# Patient Record
Sex: Male | Born: 1935 | Race: White | Hispanic: No | Marital: Married | Smoking: Former smoker
Health system: Southern US, Community
[De-identification: ages and names within clinical notes are randomized; demographics above are authoritative.]

## PROBLEM LIST (undated history)

## (undated) DIAGNOSIS — M109 Gout, unspecified: Secondary | ICD-10-CM

## (undated) DIAGNOSIS — N401 Enlarged prostate with lower urinary tract symptoms: Secondary | ICD-10-CM

## (undated) DIAGNOSIS — M1990 Unspecified osteoarthritis, unspecified site: Secondary | ICD-10-CM

## (undated) DIAGNOSIS — I4891 Unspecified atrial fibrillation: Secondary | ICD-10-CM

## (undated) DIAGNOSIS — I5033 Acute on chronic diastolic (congestive) heart failure: Principal | ICD-10-CM

## (undated) DIAGNOSIS — Z950 Presence of cardiac pacemaker: Secondary | ICD-10-CM

## (undated) DIAGNOSIS — I8310 Varicose veins of unspecified lower extremity with inflammation: Secondary | ICD-10-CM

## (undated) DIAGNOSIS — R011 Cardiac murmur, unspecified: Secondary | ICD-10-CM

## (undated) DIAGNOSIS — I10 Essential (primary) hypertension: Secondary | ICD-10-CM

## (undated) DIAGNOSIS — E119 Type 2 diabetes mellitus without complications: Secondary | ICD-10-CM

## (undated) DIAGNOSIS — C4491 Basal cell carcinoma of skin, unspecified: Secondary | ICD-10-CM

## (undated) DIAGNOSIS — IMO0002 Reserved for concepts with insufficient information to code with codable children: Secondary | ICD-10-CM

## (undated) DIAGNOSIS — R079 Chest pain, unspecified: Secondary | ICD-10-CM

## (undated) DIAGNOSIS — I059 Rheumatic mitral valve disease, unspecified: Secondary | ICD-10-CM

## (undated) DIAGNOSIS — D649 Anemia, unspecified: Secondary | ICD-10-CM

## (undated) DIAGNOSIS — I509 Heart failure, unspecified: Secondary | ICD-10-CM

## (undated) DIAGNOSIS — R39198 Other difficulties with micturition: Secondary | ICD-10-CM

## (undated) DIAGNOSIS — I8390 Asymptomatic varicose veins of unspecified lower extremity: Secondary | ICD-10-CM

## (undated) DIAGNOSIS — J45909 Unspecified asthma, uncomplicated: Secondary | ICD-10-CM

## (undated) DIAGNOSIS — R413 Other amnesia: Secondary | ICD-10-CM

## (undated) DIAGNOSIS — I209 Angina pectoris, unspecified: Secondary | ICD-10-CM

## (undated) DIAGNOSIS — Z9289 Personal history of other medical treatment: Secondary | ICD-10-CM

## (undated) DIAGNOSIS — E7800 Pure hypercholesterolemia, unspecified: Secondary | ICD-10-CM

## (undated) DIAGNOSIS — I809 Phlebitis and thrombophlebitis of unspecified site: Secondary | ICD-10-CM

## (undated) DIAGNOSIS — K921 Melena: Secondary | ICD-10-CM

## (undated) DIAGNOSIS — N139 Obstructive and reflux uropathy, unspecified: Secondary | ICD-10-CM

## (undated) HISTORY — DX: Chest pain, unspecified: R07.9

## (undated) HISTORY — DX: Anemia, unspecified: D64.9

## (undated) HISTORY — DX: Other amnesia: R41.3

## (undated) HISTORY — PX: SKIN CANCER EXCISION: SHX779

## (undated) HISTORY — DX: Melena: K92.1

## (undated) HISTORY — PX: TONSILLECTOMY: SUR1361

## (undated) HISTORY — PX: MOHS SURGERY: SUR867

## (undated) HISTORY — PX: HIP FRACTURE SURGERY: SHX118

## (undated) HISTORY — DX: Other difficulties with micturition: R39.198

## (undated) HISTORY — DX: Benign prostatic hyperplasia with lower urinary tract symptoms: N40.1

## (undated) HISTORY — PX: CATARACT EXTRACTION W/ INTRAOCULAR LENS IMPLANT: SHX1309

## (undated) HISTORY — DX: Obstructive and reflux uropathy, unspecified: N13.9

## (undated) HISTORY — PX: INSERT / REPLACE / REMOVE PACEMAKER: SUR710

## (undated) HISTORY — DX: Varicose veins of unspecified lower extremity with inflammation: I83.10

## (undated) HISTORY — DX: Rheumatic mitral valve disease, unspecified: I05.9

## (undated) HISTORY — DX: Gout, unspecified: M10.9

## (undated) HISTORY — DX: Angina pectoris, unspecified: I20.9

---

## 2011-02-06 LAB — MDC_IDC_ENUM_SESS_TYPE_REMOTE
Date Time Interrogation Session: 20120605170946
Lead Channel Impedance Value: 0 Ohm
Lead Channel Impedance Value: 456 Ohm
Lead Channel Pacing Threshold Amplitude: 0.5 V
Lead Channel Pacing Threshold Pulse Width: 0.4 ms
Lead Channel Setting Pacing Amplitude: 1 V
Lead Channel Setting Sensing Sensitivity: 2.8 mV
MDC IDC MSMT BATTERY IMPEDANCE: 1503 Ohm
MDC IDC MSMT BATTERY REMAINING LONGEVITY: 45 mo
MDC IDC MSMT BATTERY VOLTAGE: 2.76 V
MDC IDC SET LEADCHNL RV PACING PULSEWIDTH: 0.4 ms
MDC IDC STAT BRADY RV PERCENT PACED: 43 %

## 2011-05-07 ENCOUNTER — Emergency Department (HOSPITAL_BASED_OUTPATIENT_CLINIC_OR_DEPARTMENT_OTHER)
Admission: EM | Admit: 2011-05-07 | Discharge: 2011-05-07 | Disposition: A | Discharge status: Home / Self Care | Payer: Medicare Other | Attending: Emergency Medicine | Admitting: Emergency Medicine

## 2011-05-07 DIAGNOSIS — L089 Local infection of the skin and subcutaneous tissue, unspecified: Secondary | ICD-10-CM | POA: Insufficient documentation

## 2011-05-07 DIAGNOSIS — E119 Type 2 diabetes mellitus without complications: Secondary | ICD-10-CM | POA: Insufficient documentation

## 2011-05-07 DIAGNOSIS — I4891 Unspecified atrial fibrillation: Secondary | ICD-10-CM | POA: Insufficient documentation

## 2011-05-07 HISTORY — DX: Unspecified atrial fibrillation: I48.91

## 2011-05-07 HISTORY — DX: Asymptomatic varicose veins of unspecified lower extremity: I83.90

## 2011-05-07 MED ORDER — CLINDAMYCIN HCL 150 MG PO CAPS: 150.0000 mg | ORAL_CAPSULE | Freq: Three times a day (TID) | ORAL | Status: AC

## 2011-05-07 NOTE — ED Notes (Signed)
Redness, swelling and warmth noted to anterior aspect of left LE. Pt reports he has a varicose vein that bleeds but area appears infected today.

## 2011-05-07 NOTE — ED Provider Notes (Signed)
History     CSN: [id number] Arrival date & time: 05/07/2011  1:12 PM  Chief Complaint  Patient presents with  . Wound Infection   HPI Comments: Pt states that he has a history of vein protrusion to the leg and now the area around it is red and he is concerned for infection  Patient is a 77 y.o. male presenting with rash. The history is provided by the patient. No language interpreter was used.  Rash  This is a new problem. The current episode started yesterday. The problem has not changed since onset.The problem is associated with nothing. There has been no fever. The rash is present on the left lower leg. The pain is moderate. The pain has been constant since onset. Pertinent negatives include no blisters, no itching, no pain and no weeping. He has tried nothing for the symptoms. The treatment provided no relief.    Past Medical History  Diagnosis Date  . Diabetes mellitus   . Atrial fibrillation   . Varicose vein of leg     Past Surgical History  Procedure Date  . Tonsillectomy   . Joint replacement   . Pacemaker insertion     No family history on file.  History  Substance Use Topics  . Smoking status: Never Smoker   . Smokeless tobacco: Never Used  . Alcohol Use: No      Review of Systems  Constitutional: Negative.   Respiratory: Negative.   Cardiovascular: Negative.   Musculoskeletal:       Pt denies any pain with ambulation   Skin: Positive for rash. Negative for itching.  Psychiatric/Behavioral: Negative.     Physical Exam  BP 124/98  Pulse 88  Temp(Src) 98 F (36.7 C) (Oral)  Resp 20  Ht 5\' 9"  (1.753 m)  Wt 193 lb (87.544 kg)  BMI 28.50 kg/m2  SpO2 96%  Physical Exam  Nursing note and vitals reviewed. Constitutional: He appears well-developed and well-nourished.  Pulmonary/Chest: Effort normal and breath sounds normal.  Musculoskeletal: Normal range of motion.  Neurological: He is alert.  Skin:       Pt has a localized area of redness to the  left lower leg that is not fluctuant or firm and up above the areas is vein protrusion without any sign of infection    ED Course  Procedures  MDM Will treat for a localized infection:no concerning for dvt      Teressa Lower, NP 05/07/11 1448

## 2011-05-07 NOTE — ED Notes (Signed)
Affected area cleansed with saline, nonadherent dressing, 4x4 and kerlix applied.

## 2011-05-16 NOTE — ED Provider Notes (Signed)
History/physical exam/procedure(s) were performed by non-physician practitioner and as supervising physician I was immediately available for consultation/collaboration. I have reviewed all notes and am in agreement with care and plan.  Hilario Quarry, MD 05/16/11 [phone]

## 2013-11-16 ENCOUNTER — Encounter (HOSPITAL_BASED_OUTPATIENT_CLINIC_OR_DEPARTMENT_OTHER): Payer: Self-pay | Admitting: Emergency Medicine

## 2013-11-16 ENCOUNTER — Emergency Department (HOSPITAL_BASED_OUTPATIENT_CLINIC_OR_DEPARTMENT_OTHER)
Admission: EM | Admit: 2013-11-16 | Discharge: 2013-11-16 | Disposition: A | Discharge status: Home / Self Care | Payer: Medicare Other | Attending: Emergency Medicine | Admitting: Emergency Medicine

## 2013-11-16 ENCOUNTER — Emergency Department (HOSPITAL_BASED_OUTPATIENT_CLINIC_OR_DEPARTMENT_OTHER): Payer: Medicare Other

## 2013-11-16 DIAGNOSIS — E119 Type 2 diabetes mellitus without complications: Secondary | ICD-10-CM | POA: Insufficient documentation

## 2013-11-16 DIAGNOSIS — Z79899 Other long term (current) drug therapy: Secondary | ICD-10-CM | POA: Insufficient documentation

## 2013-11-16 DIAGNOSIS — T07XXXA Unspecified multiple injuries, initial encounter: Secondary | ICD-10-CM

## 2013-11-16 DIAGNOSIS — S0990XA Unspecified injury of head, initial encounter: Secondary | ICD-10-CM | POA: Insufficient documentation

## 2013-11-16 DIAGNOSIS — Y929 Unspecified place or not applicable: Secondary | ICD-10-CM | POA: Insufficient documentation

## 2013-11-16 DIAGNOSIS — Y9389 Activity, other specified: Secondary | ICD-10-CM | POA: Insufficient documentation

## 2013-11-16 DIAGNOSIS — Z950 Presence of cardiac pacemaker: Secondary | ICD-10-CM | POA: Insufficient documentation

## 2013-11-16 DIAGNOSIS — Z7901 Long term (current) use of anticoagulants: Secondary | ICD-10-CM | POA: Insufficient documentation

## 2013-11-16 DIAGNOSIS — Z7982 Long term (current) use of aspirin: Secondary | ICD-10-CM | POA: Insufficient documentation

## 2013-11-16 DIAGNOSIS — I10 Essential (primary) hypertension: Secondary | ICD-10-CM | POA: Insufficient documentation

## 2013-11-16 DIAGNOSIS — IMO0002 Reserved for concepts with insufficient information to code with codable children: Secondary | ICD-10-CM | POA: Insufficient documentation

## 2013-11-16 DIAGNOSIS — I4891 Unspecified atrial fibrillation: Secondary | ICD-10-CM | POA: Insufficient documentation

## 2013-11-16 DIAGNOSIS — W1809XA Striking against other object with subsequent fall, initial encounter: Secondary | ICD-10-CM | POA: Insufficient documentation

## 2013-11-16 HISTORY — DX: Essential (primary) hypertension: I10

## 2013-11-16 MED ORDER — LIDOCAINE-EPINEPHRINE-TETRACAINE (LET) SOLUTION
NASAL | Status: AC
  Filled 2013-11-16: qty 3

## 2013-11-16 MED ORDER — LIDOCAINE-EPINEPHRINE-TETRACAINE (LET) SOLUTION
3.0000 mL | Freq: Once | NASAL | Status: AC
  Administered 2013-11-16: 3 mL via TOPICAL

## 2013-11-16 NOTE — ED Notes (Signed)
Pt reports he fell in kitchen because his shoes were "sticky" and didn't turn when he did- states he sat down then fell backwards and hit head- denies LOC

## 2013-11-16 NOTE — ED Provider Notes (Signed)
CSN: [id number]     Arrival date & time 11/16/13  0009 History   First MD Initiated Contact with Patient 11/16/13 0029     This chart was scribed for Cydne Grahn Alfonso Patten, MD by Forrestine Him, ED Scribe. This patient was seen in room MH11/MH11 and the patient's care was started 12:31 AM.   Chief Complaint  Patient presents with  . Fall  . Head Injury   Patient is a 80 y.o. male presenting with fall. The history is provided by the patient and the spouse. No language interpreter was used.  Fall This is a new problem. The current episode started 1 to 2 hours ago. The problem occurs constantly. The problem has not changed since onset.Pertinent negatives include no chest pain, no abdominal pain, no headaches and no shortness of breath. Nothing aggravates the symptoms. Nothing relieves the symptoms. He has tried nothing for the symptoms.    HPI Comments: Siddh Vandeventer is a 80 y.o. male who presents to the Emergency Department complaining of a fall that occurred earlier this evening around 10:30.  Pt states he fell backwards in the kitchen and landed on the floor hitting his head. He states he noted an open wound to back of his head shortly after impact. He denies any vomiting at this time. States he was previously on Coumadin, but states he has stopped his prescription. Denies currently being on any other blood thinners. Pt has a PMHx of DM, A-Fib, and HTN. No other concerns this visit.    Past Medical History  Diagnosis Date  . Diabetes mellitus   . Atrial fibrillation   . Varicose vein of leg   . Hypertension    Past Surgical History  Procedure Laterality Date  . Tonsillectomy    . Joint replacement    . Pacemaker insertion     No family history on file. History  Substance Use Topics  . Smoking status: Never Smoker   . Smokeless tobacco: Never Used  . Alcohol Use: No    Review of Systems  Constitutional: Negative for fever and chills.  HENT: Negative for congestion.   Eyes:  Negative for redness.  Respiratory: Negative for cough and shortness of breath.   Cardiovascular: Negative for chest pain.  Gastrointestinal: Negative for nausea, vomiting and abdominal pain.  Skin: Positive for wound. Negative for rash.  Neurological: Negative for headaches.  Psychiatric/Behavioral: Negative for confusion.  All other systems reviewed and are negative.      Allergies  Review of patient's allergies indicates no known allergies.  Home Medications   Current Outpatient Rx  Name  Route  Sig  Dispense  Refill  . aspirin 81 MG tablet   Oral   Take 81 mg by mouth daily.           . furosemide (LASIX) 40 MG tablet   Oral   Take 40 mg by mouth daily.           . metFORMIN (GLUCOPHAGE-XR) 500 MG 24 hr tablet   Oral   Take 500 mg by mouth daily with breakfast.           . metoprolol (TOPROL-XL) 50 MG 24 hr tablet   Oral   Take 50 mg by mouth daily.           . Multiple Vitamins-Minerals (MULTIVITAMIN WITH IRON-MINERALS) liquid   Oral   Take by mouth daily.         . potassium chloride (KLOR-CON) 20 MEQ packet  Oral   Take 20 mEq by mouth daily.           . trandolapril (MAVIK) 2 MG tablet   Oral   Take 2 mg by mouth daily.           Marland Kitchen warfarin (COUMADIN) 5 MG tablet   Oral   Take 5 mg by mouth daily.            BP 103/50  Pulse 71  Temp(Src) 98.1 F (36.7 C) (Oral)  Resp 20  Ht 5\' 7"  (1.702 m)  Wt 180 lb (81.647 kg)  BMI 28.19 kg/m2  SpO2 99%  Physical Exam  Nursing note and vitals reviewed. Constitutional: He is oriented to person, place, and time. He appears well-developed and well-nourished.  HENT:  Head: Normocephalic and atraumatic. Head is without raccoon's eyes and without Battle's sign.  Right Ear: External ear normal. No mastoid tenderness. No hemotympanum.  Left Ear: No mastoid tenderness. No hemotympanum.  Mouth/Throat: Oropharynx is clear and moist.   Uvula midline  Eyes: Conjunctivae and EOM are normal. Pupils  are equal, round, and reactive to light.  Pin point bilaterally  Neck: Normal range of motion. Neck supple.  No midline tenderness crepitance or step offs  Cardiovascular: Normal rate, regular rhythm, normal heart sounds and intact distal pulses.   Pulmonary/Chest: Effort normal and breath sounds normal. No respiratory distress. He has no wheezes. He has no rales.  Abdominal: Soft. Bowel sounds are normal. He exhibits no distension. There is no tenderness. There is no rebound and no guarding.  Musculoskeletal: Normal range of motion.  No stepoff of C, T, or L spine  Neurological: He is alert and oriented to person, place, and time. He has normal reflexes.  Skin: Skin is warm and dry.  2 inch hematoma to occiput  Occiput scamp bleeding  Psychiatric: He has a normal mood and affect. Judgment normal.    ED Course  Procedures (including critical care time)  DIAGNOSTIC STUDIES: Oxygen Saturation is 99% on RA, Normal by my interpretation.    COORDINATION OF CARE: 12:30 AM- Will apply LET. Will order CBC, Basic metabolic panel, and PTINR. Discussed treatment plan with pt at bedside and pt agreed to plan.     Labs Review Labs Reviewed  CBC WITH DIFFERENTIAL  BASIC METABOLIC PANEL  PROTIME-INR   Imaging Review No results found.   EKG Interpretation None      MDM   Final diagnoses:  None    Abrasions of the scalp treated with wound sealant.  Change dressing in 24 hours apply neosporin BID x 7 days return for any problems.  Patient and wife verbalize understanding and agree to follow up  I personally performed the services described in this documentation, which was scribed in my presence. The recorded information has been reviewed and is accurate.    Carlisle Beers, MD 11/16/13 [phone]

## 2013-11-16 NOTE — Discharge Instructions (Signed)
Abrasion °An abrasion is a cut or scrape of the skin. Abrasions do not extend through all layers of the skin and most heal within 10 days. It is important to care for your abrasion properly to prevent infection. °CAUSES  °Most abrasions are caused by falling on, or gliding across, the ground or other surface. When your skin rubs on something, the outer and inner layer of skin rubs off, causing an abrasion. °DIAGNOSIS  °Your caregiver will be able to diagnose an abrasion during a physical exam.  °TREATMENT  °Your treatment depends on how large and deep the abrasion is. Generally, your abrasion will be cleaned with water and a mild soap to remove any dirt or debris. An antibiotic ointment may be put over the abrasion to prevent an infection. A bandage (dressing) may be wrapped around the abrasion to keep it from getting dirty.  °You may need a tetanus shot if: °· You cannot remember when you had your last tetanus shot. °· You have never had a tetanus shot. °· The injury broke your skin. °If you get a tetanus shot, your arm may swell, get red, and feel warm to the touch. This is common and not a problem. If you need a tetanus shot and you choose not to have one, there is a rare chance of getting tetanus. Sickness from tetanus can be serious.  °HOME CARE INSTRUCTIONS  °· If a dressing was applied, change it at least once a day or as directed by your caregiver. If the bandage sticks, soak it off with warm water.   °· Wash the area with water and a mild soap to remove all the ointment 2 times a day. Rinse off the soap and pat the area dry with a clean towel.   °· Reapply any ointment as directed by your caregiver. This will help prevent infection and keep the bandage from sticking. Use gauze over the wound and under the dressing to help keep the bandage from sticking.   °· Change your dressing right away if it becomes wet or dirty.   °· Only take over-the-counter or prescription medicines for pain, discomfort, or fever as  directed by your caregiver.   °· Follow up with your caregiver within 24 48 hours for a wound check, or as directed. If you were not given a wound-check appointment, look closely at your abrasion for redness, swelling, or pus. These are signs of infection. °SEEK IMMEDIATE MEDICAL CARE IF:  °· You have increasing pain in the wound.   °· You have redness, swelling, or tenderness around the wound.   °· You have pus coming from the wound.   °· You have a fever or persistent symptoms for more than 2 3 days. °· You have a fever and your symptoms suddenly get worse. °· You have a bad smell coming from the wound or dressing.   °MAKE SURE YOU:  °· Understand these instructions. °· Will watch your condition. °· Will get help right away if you are not doing well or get worse. °Document Released: 05/30/2005 Document Revised: 08/06/2012 Document Reviewed: 07/24/2011 °ExitCare® Patient Information ©2014 [employer]. ° °

## 2014-02-16 ENCOUNTER — Encounter: Payer: Self-pay | Admitting: *Deleted

## 2014-02-17 ENCOUNTER — Ambulatory Visit (INDEPENDENT_AMBULATORY_CARE_PROVIDER_SITE_OTHER): Payer: Medicare Other | Admitting: Internal Medicine

## 2014-02-17 ENCOUNTER — Encounter: Payer: Self-pay | Admitting: Internal Medicine

## 2014-02-17 VITALS — BP 147/75 | HR 80 | Ht 67.0 in | Wt 187.0 lb

## 2014-02-17 DIAGNOSIS — I4891 Unspecified atrial fibrillation: Secondary | ICD-10-CM

## 2014-02-17 DIAGNOSIS — Z950 Presence of cardiac pacemaker: Secondary | ICD-10-CM

## 2014-02-17 DIAGNOSIS — I10 Essential (primary) hypertension: Secondary | ICD-10-CM

## 2014-02-17 LAB — MDC_IDC_ENUM_SESS_TYPE_INCLINIC
Battery Remaining Longevity: 11 mo
Brady Statistic RV Percent Paced: 80 %
Date Time Interrogation Session: 20150617191601
Lead Channel Impedance Value: 410 Ohm
Lead Channel Pacing Threshold Amplitude: 0.5 V
Lead Channel Pacing Threshold Pulse Width: 0.4 ms
Lead Channel Sensing Intrinsic Amplitude: 5.6 mV
Lead Channel Setting Pacing Pulse Width: 0.4 ms
MDC IDC MSMT BATTERY IMPEDANCE: 4359 Ohm
MDC IDC MSMT BATTERY VOLTAGE: 2.69 V
MDC IDC MSMT LEADCHNL RA IMPEDANCE VALUE: 0 Ohm
MDC IDC SET LEADCHNL RV PACING AMPLITUDE: 2.5 V
MDC IDC SET LEADCHNL RV SENSING SENSITIVITY: 2 mV

## 2014-02-17 NOTE — Patient Instructions (Addendum)
Labs today: CMET, CBCD, TSH  Your physician has requested that you have an echocardiogram. Echocardiography is a painless test that uses sound waves to create images of your heart. It provides your doctor with information about the size and shape of your heart and how well your heart's chambers and valves are working. This procedure takes approximately one hour. There are no restrictions for this procedure.  Remote monitoring is used to monitor your pacemaker from home. This monitoring reduces the number of office visits required to check your device to one time per year. It allows [place] to keep an eye on the functioning of your device to ensure it is working properly. You are scheduled for a device check from home on 05-24-2014. You may send your transmission at any time that day. If you have a wireless device, the transmission will be sent automatically. After your physician reviews your transmission, you will receive a postcard with your next transmission date.  Your physician recommends that you schedule a follow-up appointment in: 12 months with Dr.Klein

## 2014-02-17 NOTE — Progress Notes (Signed)
ELECTROPHYSIOLOGY CONSULT NOTE  Patient ID: [name] [name], MRN: [id number], DOB/AGE: 1924/01/02 80 y.o. Admit date: (Not on file) Date of Consult: 02/17/2014  Primary Physician: Mendel Ryder, MD Primary Cardiologist: [place]  [place]   Chief Complaint: atrial fibrillation   HPI [name] [name] is a 80 y.o. male  w hx of VVI pacemaker implanted 10 + yrs ago for presumed tachy brady as patient carries a diagnosis of atrial fibrillation   He has complaints of edema DOE and PND  Although he denies chest pain or syncope  This has been longstanding   TERF incl DM and age, HTN   He is currently not on anticoagulation this may because anemia   Prior transfusions helped tremendously.            Past Medical History  Diagnosis Date  . Diabetes mellitus   . Atrial fibrillation   . Varicose vein of leg   . Hypertension   . Anemia, unspecified   . Other and unspecified angina pectoris   . Chest pain, unspecified   . Gastric ulcer, unspecified as acute or chronic, without mention of hemorrhage, perforation, or obstruction   . Gouty arthropathy, unspecified   . Blood in stool   . Memory loss   . Mitral valve disorders   . Benign localized hyperplasia of prostate with urinary obstruction and other lower urinary tract symptoms (LUTS)(600.21)   . Urinary obstruction, not elsewhere classified   . Varicose veins of lower extremities with inflammation   . Slowing of urinary stream       Surgical History:  Past Surgical History  Procedure Laterality Date  . Tonsillectomy    . Joint replacement    . Pacemaker insertion    . Cataract extraction w/ intraocular lens implant       Home Meds: Prior to Admission medications   Medication Sig Start Date End Date Taking? Authorizing Provider  aspirin 81 MG tablet Take 81 mg by mouth daily.     Yes Historical Provider, MD  furosemide (LASIX) 40 MG tablet Take 40 mg by mouth daily.     Yes Historical Provider, MD  metFORMIN  (GLUCOPHAGE-XR) 500 MG 24 hr tablet Take 500 mg by mouth daily with breakfast.     Yes Historical Provider, MD  metoprolol (TOPROL-XL) 50 MG 24 hr tablet Take 50 mg by mouth daily.     Yes Historical Provider, MD  Multiple Vitamins-Minerals (MULTIVITAMIN WITH IRON-MINERALS) liquid Take by mouth daily.   Yes Historical Provider, MD  pantoprazole (PROTONIX) 40 MG tablet Take 40 mg by mouth daily.   Yes Historical Provider, MD  potassium chloride (KLOR-CON) 20 MEQ packet Take 20 mEq by mouth daily.     Yes Historical Provider, MD  tamsulosin (FLOMAX) 0.4 MG CAPS capsule Take 0.4 mg by mouth daily.   Yes Historical Provider, MD  trandolapril (MAVIK) 2 MG tablet Take 2 mg by mouth daily.     Yes Historical Provider, MD  triamcinolone cream (KENALOG) 0.1 % Apply 1 application topically 2 (two) times daily.   Yes Historical Provider, MD    Allergies: No Known Allergies  History   Social History  . Marital Status: Married    Spouse Name: N/A    Number of Children: N/A  . Years of Education: N/A   Occupational History  . Not on file.   Social History Main Topics  . Smoking status: Never Smoker   . Smokeless tobacco: Never Used  . Alcohol Use: No  .  Drug Use: No  . Sexual Activity: Not on file   Other Topics Concern  . Not on file   Social History Narrative  . No narrative on file    History reviewed. No pertinent family history.   ROS:  Please see the history of present illness.     All other systems reviewed and negative.    Physical Exam  Blood pressure 147/75, pulse 80, height 5\' 7"  (1.702 m), weight 187 lb (84.823 kg). General: Well developed, well nourished male in no acute distress. Head: Normocephalic, atraumatic, sclera non-icteric, no xanthomas, nares are without discharge. EENT: normal Lymph Nodes:  none Back: without scoliosis/kyphosis  no CVA tendersness Neck: Negative for carotid bruits. JVD >10. Lungs: Clear bilaterally to auscultation without wheezes, rales, or  rhonchi. Breathing is unlabored. Heart: RRR with S1 S2.  3/6 systolic murmur , RV heave no rubs, or gallops appreciated. Abdomen: Soft, non-tender, non-distended with normoactive bowel sounds. No hepatomegaly. No rebound/guarding. No obvious abdominal masses. Msk:  Strength and tone appear normal for age. Extremities: No clubbing or cyanosis. 3+ edema.  Distal pedal pulses are 2+ and equal bilaterally. Skin: Warm and Dry Neuro: Alert and oriented X 3. CN III-XII intact Grossly normal sensory and motor function . Psych:  Responds to questions appropriately with a normal affect.      Labs: Cardiac Enzymes No results found for this basename: CKTOTAL, CKMB, TROPONINI,  in the last 72 hours CBC No results found for this basename: WBC, HGB, HCT, MCV, PLT   PROTIME: No results found for this basename: LABPROT, INR,  in the last 72 hours Chemistry No results found for this basename: NA, K, CL, CO2, BUN, CREATININE, CALCIUM, LABALBU, PROT, BILITOT, ALKPHOS, ALT, AST, GLUCOSE,  in the last 168 hours Lipids No results found for this basename: CHOL, HDL, LDLCALC, TRIG   BNP No results found for this basename: probnp   Miscellaneous No results found for this basename: DDIMER    Radiology/Studies:  No results found.  EKG:      Assessment and Plan:   Atrial fibrillation-history of  Pacemaker-[employer]  GI bleeding with incomplete evaluation  Congestive heart failure with significant volume overload  Abnormal supraventricular rhythm question mechanism  The patient presents with a pacemaker a history of atrial fibrillation and symptoms of congestive heart failure with evidence of right and left ventricular volume overload. We know very little. He has a murmur suggestive of mitral regurgitation.  They opened up the discussions by saying they did not want to do extensive treatments as they are nearing end-of-life.  We discussed the potential role of an ultrasound and renal function  testing to try to understand treatment options were mitigate some of the symptoms of his heart failure.  We discussed the importance of anticoagulation if in fact atrial fibrillation is the rhythm. His atrial fibrillation was diagnosed to do. His heart rhythm now is clearly not atrial fibrillation. It is absolutely fixed on RR interval. No discernible P waves are seen. In the spots following to slow ventricular paced beats no P waves discern. I suspect that this is a junctional rhythm    Virl Axe

## 2014-02-18 LAB — COMPREHENSIVE METABOLIC PANEL
ALBUMIN: 4.2 g/dL (ref 3.5–5.2)
ALK PHOS: 49 U/L (ref 39–117)
ALT: 13 U/L (ref 0–53)
AST: 22 U/L (ref 0–37)
BUN: 31 mg/dL — AB (ref 6–23)
CO2: 30 mEq/L (ref 19–32)
Calcium: 9.3 mg/dL (ref 8.4–10.5)
Chloride: 102 mEq/L (ref 96–112)
Creatinine, Ser: 1.3 mg/dL (ref 0.4–1.5)
GFR: 54.92 mL/min — ABNORMAL LOW (ref >60.00)
Glucose, Bld: 100 mg/dL — ABNORMAL HIGH (ref 70–99)
POTASSIUM: 5.7 meq/L — AB (ref 3.5–5.1)
SODIUM: 139 meq/L (ref 135–145)
Total Bilirubin: 1.4 mg/dL — ABNORMAL HIGH (ref 0.2–1.2)
Total Protein: 6.8 g/dL (ref 6.0–8.3)

## 2014-02-18 LAB — CBC WITH DIFFERENTIAL/PLATELET
BASOS PCT: 0.1 % (ref 0.0–3.0)
Basophils Absolute: 0 10*3/uL (ref 0.0–0.1)
EOS ABS: 0.1 10*3/uL (ref 0.0–0.7)
Eosinophils Relative: 1 % (ref 0.0–5.0)
HCT: 25.8 % — ABNORMAL LOW (ref 39.0–52.0)
Hemoglobin: 8.1 g/dL — ABNORMAL LOW (ref 13.0–17.0)
Lymphocytes Relative: 14.8 % (ref 12.0–46.0)
Lymphs Abs: 1 10*3/uL (ref 0.7–4.0)
MCHC: 31.4 g/dL (ref 30.0–36.0)
MCV: 79.2 fl (ref 78.0–100.0)
MONO ABS: 0.5 10*3/uL (ref 0.1–1.0)
Monocytes Relative: 6.9 % (ref 3.0–12.0)
NEUTROS ABS: 5.1 10*3/uL (ref 1.4–7.7)
NEUTROS PCT: 77.2 % — AB (ref 43.0–77.0)
Platelets: 151 10*3/uL (ref 150.0–400.0)
RBC: 3.26 Mil/uL — AB (ref 4.22–5.81)
RDW: 22.5 % — ABNORMAL HIGH (ref 11.5–15.5)
WBC: 6.6 10*3/uL (ref 4.0–10.5)

## 2014-02-18 LAB — TSH: TSH: 2.94 u[IU]/mL (ref 0.35–4.50)

## 2014-02-22 ENCOUNTER — Telehealth: Payer: Self-pay | Admitting: *Deleted

## 2014-02-22 ENCOUNTER — Other Ambulatory Visit: Payer: Medicare Other

## 2014-02-22 ENCOUNTER — Other Ambulatory Visit: Payer: Self-pay | Admitting: *Deleted

## 2014-02-22 DIAGNOSIS — D649 Anemia, unspecified: Secondary | ICD-10-CM

## 2014-02-22 DIAGNOSIS — E875 Hyperkalemia: Secondary | ICD-10-CM

## 2014-02-22 NOTE — Telephone Encounter (Signed)
Explained that we would like to draw more blood work (they had some drawn today). Need to check: BMET/CBCD/Ferritin/Fecal occult, guaiac stool card  Will send to triage to fax orders to [facility] at [facility] (as I am out of office tomorrow)      Fax # [phone]   Pt's wife is agreeable to plan.

## 2014-02-23 NOTE — Telephone Encounter (Signed)
done

## 2014-02-24 ENCOUNTER — Telehealth: Payer: Self-pay | Admitting: Internal Medicine

## 2014-02-24 NOTE — Telephone Encounter (Signed)
Stanton Kidney, RN at 02/22/2014 7:22 PM    Status: Signed        Explained that we would like to draw more blood work (they had some drawn today).  Need to check: BMET/CBCD/Ferritin/Fecal occult, guaiac stool card  Will send to triage to fax orders to [facility] at [facility] (as I am out of office tomorrow)  Fax # [phone]  Pt's wife is agreeable to plan.      Lab orders for a BMET, CBCD, Ferritin, and Guiac stool cards faxed to [facility] ([facility] manager) @ [facility] @ [phone]. Pts wife, Bonnita Nasuti, is aware.

## 2014-02-24 NOTE — Telephone Encounter (Signed)
New message      Pt is at [facility] and they are waiting for an order for pt to have labs drawn. Please call wife and let her know if we still want him to have lab work

## 2014-03-09 ENCOUNTER — Ambulatory Visit (HOSPITAL_COMMUNITY): Payer: Medicare Other | Attending: Internal Medicine | Admitting: Radiology

## 2014-03-09 DIAGNOSIS — I10 Essential (primary) hypertension: Secondary | ICD-10-CM | POA: Diagnosis not present

## 2014-03-09 DIAGNOSIS — I517 Cardiomegaly: Secondary | ICD-10-CM | POA: Diagnosis not present

## 2014-03-09 DIAGNOSIS — E119 Type 2 diabetes mellitus without complications: Secondary | ICD-10-CM | POA: Diagnosis not present

## 2014-03-09 DIAGNOSIS — I059 Rheumatic mitral valve disease, unspecified: Secondary | ICD-10-CM | POA: Diagnosis not present

## 2014-03-09 DIAGNOSIS — I079 Rheumatic tricuspid valve disease, unspecified: Secondary | ICD-10-CM | POA: Diagnosis not present

## 2014-03-09 DIAGNOSIS — I4891 Unspecified atrial fibrillation: Secondary | ICD-10-CM | POA: Insufficient documentation

## 2014-03-09 DIAGNOSIS — R079 Chest pain, unspecified: Secondary | ICD-10-CM | POA: Diagnosis not present

## 2014-03-09 DIAGNOSIS — I359 Nonrheumatic aortic valve disorder, unspecified: Secondary | ICD-10-CM | POA: Diagnosis not present

## 2014-03-09 NOTE — Progress Notes (Signed)
Echocardiogram performed.  

## 2014-03-18 ENCOUNTER — Telehealth: Payer: Self-pay | Admitting: Internal Medicine

## 2014-03-18 NOTE — Telephone Encounter (Signed)
Patient would like to know results of lab work and echo. Please call and advise.

## 2014-03-18 NOTE — Telephone Encounter (Signed)
I spoke with the pt's wife and gave her the preliminary results of Echocardiogram.  I once again reviewed the pt's June labs with her. She forgot to have the pt repeat BMP on 02/22/14 and she is unsure if the pt is taking potassium at this time or if it has been on hold since 02/18/14. I also made her aware that the pt's hemoglobin was low and that he needs to follow-up with PCP for anemia evaluation.  She would like the pt's lab results and Echo faxed to Dr Drusilla Kanner at [phone].  She plans to schedule follow-up with PCP asap to have a BMP rechecked and follow-up on anemia.  She also asked if the pt should be taking warfarin.  She states this was on the pt's medication list and that they discussed this with Dr Caryl Comes but the pt is not taking Warfarin.  I made her aware that she should not restart this medication.  The pt's hemoglobin on recent lab was 8.1.  I will forward this message to Trinidad Curet RN to have Dr Caryl Comes review the pt's echo and then fax these results to PCP per the wife's request.

## 2014-03-22 NOTE — Telephone Encounter (Signed)
Sent requested lab results and echo to PCP through [facility]

## 2014-03-23 ENCOUNTER — Telehealth: Payer: Self-pay | Admitting: Internal Medicine

## 2014-03-23 NOTE — Telephone Encounter (Signed)
New message     Talk to someone in the device clinic.  Pt needs to have his box transferred over to Dr Caryl Comes and no longer in the previous cardiologist name.

## 2014-03-23 NOTE — Telephone Encounter (Signed)
New message    Regional physician calling stating the wife called - Dr.  Caryl Comes need to have his K+ recheck.    Order fax to [place]  at [facility] fax [phone]

## 2014-03-23 NOTE — Telephone Encounter (Signed)
Spoke with pt's wife, who spoke with Dr. Caryl Comes today - will fax order for BMET to [facility].

## 2014-03-23 NOTE — Telephone Encounter (Signed)
Called and informed pt wife that monitor has been transferred to MD name and informed her to send manual transmission. Pt verbalized understanding. Pt had lab work ordered by MD done at [facility] in [place], [place].

## 2014-03-25 ENCOUNTER — Telehealth: Payer: Self-pay | Admitting: Internal Medicine

## 2014-03-25 NOTE — Telephone Encounter (Signed)
Informed wife that I did not call her.

## 2014-03-25 NOTE — Telephone Encounter (Signed)
New message ° ° ° ° °Returning Sherri's call °

## 2014-03-26 ENCOUNTER — Encounter: Payer: Self-pay | Admitting: Internal Medicine

## 2014-03-31 ENCOUNTER — Encounter (HOSPITAL_COMMUNITY): Payer: Self-pay | Admitting: Emergency Medicine

## 2014-03-31 ENCOUNTER — Emergency Department (HOSPITAL_COMMUNITY): Payer: Medicare Other

## 2014-03-31 ENCOUNTER — Inpatient Hospital Stay (HOSPITAL_COMMUNITY)
Admission: EM | Admit: 2014-03-31 | Discharge: 2014-04-03 | DRG: 293 | Disposition: A | Discharge status: Home / Self Care | Payer: Medicare Other | Attending: Family Medicine | Admitting: Family Medicine

## 2014-03-31 DIAGNOSIS — I440 Atrioventricular block, first degree: Secondary | ICD-10-CM | POA: Diagnosis present

## 2014-03-31 DIAGNOSIS — I279 Pulmonary heart disease, unspecified: Secondary | ICD-10-CM

## 2014-03-31 DIAGNOSIS — I272 Pulmonary hypertension, unspecified: Secondary | ICD-10-CM

## 2014-03-31 DIAGNOSIS — I5032 Chronic diastolic (congestive) heart failure: Secondary | ICD-10-CM | POA: Diagnosis present

## 2014-03-31 DIAGNOSIS — I059 Rheumatic mitral valve disease, unspecified: Secondary | ICD-10-CM | POA: Diagnosis present

## 2014-03-31 DIAGNOSIS — I071 Rheumatic tricuspid insufficiency: Secondary | ICD-10-CM

## 2014-03-31 DIAGNOSIS — D649 Anemia, unspecified: Secondary | ICD-10-CM | POA: Diagnosis present

## 2014-03-31 DIAGNOSIS — N138 Other obstructive and reflux uropathy: Secondary | ICD-10-CM | POA: Diagnosis present

## 2014-03-31 DIAGNOSIS — K259 Gastric ulcer, unspecified as acute or chronic, without hemorrhage or perforation: Secondary | ICD-10-CM | POA: Diagnosis present

## 2014-03-31 DIAGNOSIS — R079 Chest pain, unspecified: Secondary | ICD-10-CM

## 2014-03-31 DIAGNOSIS — I509 Heart failure, unspecified: Secondary | ICD-10-CM | POA: Diagnosis present

## 2014-03-31 DIAGNOSIS — N401 Enlarged prostate with lower urinary tract symptoms: Secondary | ICD-10-CM | POA: Diagnosis present

## 2014-03-31 DIAGNOSIS — I4891 Unspecified atrial fibrillation: Secondary | ICD-10-CM

## 2014-03-31 DIAGNOSIS — I10 Essential (primary) hypertension: Secondary | ICD-10-CM | POA: Diagnosis present

## 2014-03-31 DIAGNOSIS — Z9660 Presence of unspecified orthopedic joint implant: Secondary | ICD-10-CM

## 2014-03-31 DIAGNOSIS — I079 Rheumatic tricuspid valve disease, unspecified: Secondary | ICD-10-CM | POA: Diagnosis present

## 2014-03-31 DIAGNOSIS — Z7982 Long term (current) use of aspirin: Secondary | ICD-10-CM

## 2014-03-31 DIAGNOSIS — Z950 Presence of cardiac pacemaker: Secondary | ICD-10-CM

## 2014-03-31 DIAGNOSIS — I5033 Acute on chronic diastolic (congestive) heart failure: Principal | ICD-10-CM | POA: Diagnosis present

## 2014-03-31 DIAGNOSIS — R0600 Dyspnea, unspecified: Secondary | ICD-10-CM | POA: Diagnosis present

## 2014-03-31 DIAGNOSIS — I517 Cardiomegaly: Secondary | ICD-10-CM | POA: Diagnosis present

## 2014-03-31 DIAGNOSIS — E119 Type 2 diabetes mellitus without complications: Secondary | ICD-10-CM | POA: Diagnosis present

## 2014-03-31 DIAGNOSIS — M109 Gout, unspecified: Secondary | ICD-10-CM | POA: Diagnosis present

## 2014-03-31 DIAGNOSIS — N139 Obstructive and reflux uropathy, unspecified: Secondary | ICD-10-CM | POA: Diagnosis present

## 2014-03-31 DIAGNOSIS — D509 Iron deficiency anemia, unspecified: Secondary | ICD-10-CM | POA: Diagnosis present

## 2014-03-31 HISTORY — DX: Cardiac murmur, unspecified: R01.1

## 2014-03-31 HISTORY — DX: Unspecified asthma, uncomplicated: J45.909

## 2014-03-31 HISTORY — DX: Unspecified osteoarthritis, unspecified site: M19.90

## 2014-03-31 HISTORY — DX: Heart failure, unspecified: I50.9

## 2014-03-31 HISTORY — DX: Phlebitis and thrombophlebitis of unspecified site: I80.9

## 2014-03-31 HISTORY — DX: Personal history of other medical treatment: Z92.89

## 2014-03-31 HISTORY — DX: Reserved for concepts with insufficient information to code with codable children: IMO0002

## 2014-03-31 HISTORY — DX: Acute on chronic diastolic (congestive) heart failure: I50.33

## 2014-03-31 HISTORY — DX: Pure hypercholesterolemia, unspecified: E78.00

## 2014-03-31 HISTORY — DX: Basal cell carcinoma of skin, unspecified: C44.91

## 2014-03-31 HISTORY — DX: Presence of cardiac pacemaker: Z95.0

## 2014-03-31 HISTORY — DX: Type 2 diabetes mellitus without complications: E11.9

## 2014-03-31 LAB — BASIC METABOLIC PANEL
ANION GAP: 14 (ref 5–15)
BUN: 35 mg/dL — ABNORMAL HIGH (ref 6–23)
CHLORIDE: 100 meq/L (ref 96–112)
CO2: 28 mEq/L (ref 19–32)
CREATININE: 1.33 mg/dL (ref 0.50–1.35)
Calcium: 8.8 mg/dL (ref 8.4–10.5)
GFR calc non Af Amer: 46 mL/min — ABNORMAL LOW (ref >90)
GFR, EST AFRICAN AMERICAN: 54 mL/min — AB (ref >90)
Glucose, Bld: 132 mg/dL — ABNORMAL HIGH (ref 70–99)
POTASSIUM: 3.7 meq/L (ref 3.7–5.3)
Sodium: 142 mEq/L (ref 137–147)

## 2014-03-31 LAB — POC OCCULT BLOOD, ED: FECAL OCCULT BLD: NEGATIVE

## 2014-03-31 LAB — PREPARE RBC (CROSSMATCH)

## 2014-03-31 LAB — CBC
HCT: 24.4 % — ABNORMAL LOW (ref 39.0–52.0)
Hemoglobin: 7.2 g/dL — ABNORMAL LOW (ref 13.0–17.0)
MCH: 21.4 pg — ABNORMAL LOW (ref 26.0–34.0)
MCHC: 29.5 g/dL — ABNORMAL LOW (ref 30.0–36.0)
MCV: 72.4 fL — ABNORMAL LOW (ref 78.0–100.0)
PLATELETS: 158 10*3/uL (ref 150–400)
RBC: 3.37 MIL/uL — ABNORMAL LOW (ref 4.22–5.81)
RDW: 21.3 % — AB (ref 11.5–15.5)
WBC: 6 10*3/uL (ref 4.0–10.5)

## 2014-03-31 LAB — TROPONIN I

## 2014-03-31 LAB — ABO/RH: ABO/RH(D): A POS

## 2014-03-31 LAB — PRO B NATRIURETIC PEPTIDE: Pro B Natriuretic peptide (BNP): 1194 pg/mL — ABNORMAL HIGH (ref 0–450)

## 2014-03-31 LAB — GLUCOSE, CAPILLARY: Glucose-Capillary: 105 mg/dL — ABNORMAL HIGH (ref 70–99)

## 2014-03-31 MED ORDER — FUROSEMIDE 10 MG/ML IJ SOLN: 20.0000 mg | Freq: Once | INTRAMUSCULAR | Status: DC

## 2014-03-31 MED ORDER — SODIUM CHLORIDE 0.9 % IJ SOLN: 3.0000 mL | INTRAMUSCULAR | Status: DC | PRN

## 2014-03-31 MED ORDER — TRIAMCINOLONE ACETONIDE 0.1 % EX CREA: 1.0000 "application " | TOPICAL_CREAM | Freq: Two times a day (BID) | CUTANEOUS | Status: DC | PRN

## 2014-03-31 MED ORDER — METOPROLOL SUCCINATE ER 50 MG PO TB24
50.0000 mg | ORAL_TABLET | Freq: Every day | ORAL | Status: DC
  Administered 2014-04-01 – 2014-04-03 (×3): 50 mg via ORAL
  Filled 2014-03-31 (×3): qty 1

## 2014-03-31 MED ORDER — SODIUM CHLORIDE 0.9 % IV SOLN: 250.0000 mL | INTRAVENOUS | Status: DC | PRN

## 2014-03-31 MED ORDER — SODIUM CHLORIDE 0.9 % IJ SOLN
3.0000 mL | Freq: Two times a day (BID) | INTRAMUSCULAR | Status: DC
  Administered 2014-04-01 – 2014-04-02 (×2): 3 mL via INTRAVENOUS

## 2014-03-31 MED ORDER — ASPIRIN EC 81 MG PO TBEC
81.0000 mg | DELAYED_RELEASE_TABLET | Freq: Every day | ORAL | Status: DC
  Administered 2014-04-01 – 2014-04-03 (×3): 81 mg via ORAL
  Filled 2014-03-31 (×3): qty 1

## 2014-03-31 MED ORDER — SODIUM CHLORIDE 0.9 % IJ SOLN
3.0000 mL | Freq: Two times a day (BID) | INTRAMUSCULAR | Status: DC
  Administered 2014-03-31 – 2014-04-03 (×5): 3 mL via INTRAVENOUS

## 2014-03-31 MED ORDER — PANTOPRAZOLE SODIUM 40 MG PO TBEC
40.0000 mg | DELAYED_RELEASE_TABLET | Freq: Every day | ORAL | Status: DC
  Administered 2014-03-31 – 2014-04-03 (×4): 40 mg via ORAL
  Filled 2014-03-31 (×4): qty 1

## 2014-03-31 MED ORDER — FUROSEMIDE 10 MG/ML IJ SOLN
40.0000 mg | Freq: Two times a day (BID) | INTRAMUSCULAR | Status: DC
  Administered 2014-04-01 – 2014-04-02 (×3): 40 mg via INTRAVENOUS
  Filled 2014-03-31 (×6): qty 4

## 2014-03-31 MED ORDER — TRANDOLAPRIL 4 MG PO TABS
4.0000 mg | ORAL_TABLET | Freq: Every day | ORAL | Status: DC
  Administered 2014-04-01 – 2014-04-03 (×3): 4 mg via ORAL
  Filled 2014-03-31 (×3): qty 1

## 2014-03-31 MED ORDER — TAMSULOSIN HCL 0.4 MG PO CAPS
0.4000 mg | ORAL_CAPSULE | Freq: Every day | ORAL | Status: DC
  Administered 2014-03-31 – 2014-04-02 (×3): 0.4 mg via ORAL
  Filled 2014-03-31 (×4): qty 1

## 2014-03-31 MED ORDER — FUROSEMIDE 10 MG/ML IJ SOLN: 40.0000 mg | Freq: Two times a day (BID) | INTRAMUSCULAR | Status: DC

## 2014-03-31 MED ORDER — INSULIN ASPART 100 UNIT/ML ~~LOC~~ SOLN
0.0000 [IU] | SUBCUTANEOUS | Status: DC
  Administered 2014-04-01: 1 [IU] via SUBCUTANEOUS

## 2014-03-31 MED ORDER — ADULT MULTIVITAMIN W/MINERALS CH
1.0000 | ORAL_TABLET | Freq: Every day | ORAL | Status: DC
  Administered 2014-03-31 – 2014-04-03 (×4): 1 via ORAL
  Filled 2014-03-31 (×3): qty 1

## 2014-03-31 NOTE — ED Provider Notes (Signed)
I spoke with cardiology, who would be glad to consult and assist in the patient's care  Garald Balding, NP 03/31/14 2036

## 2014-03-31 NOTE — ED Notes (Signed)
Cardiology at bedside.

## 2014-03-31 NOTE — ED Provider Notes (Addendum)
CSN: [id number]     Arrival date & time 03/31/14  1747 History   First MD Initiated Contact with Patient 03/31/14 1820     Chief Complaint  Patient presents with  . Congestive Heart Failure     (Consider location/radiation/quality/duration/timing/severity/associated sxs/prior Treatment) HPI Comments: Patient is an 80 year old male with history of diabetes, A. fib, hypertension, and CHF. He presents with complaints of dyspnea on exertion which has been worsening over the past several days. He denies any chest pain, fever, productive cough. He does report increased swelling of his legs. He takes Lasix and denies having his dose changed or missing any doses.  Patient is a 80 y.o. male presenting with CHF. The history is provided by the patient.  Congestive Heart Failure This is a new problem. The current episode started 2 days ago. The problem occurs constantly. The problem has been gradually worsening. Associated symptoms include shortness of breath. Pertinent negatives include no chest pain. The symptoms are aggravated by walking. Nothing relieves the symptoms. He has tried nothing for the symptoms. The treatment provided no relief.    Past Medical History  Diagnosis Date  . Diabetes mellitus   . Atrial fibrillation   . Varicose vein of leg   . Hypertension   . Anemia, unspecified   . Other and unspecified angina pectoris   . Chest pain, unspecified   . Gastric ulcer, unspecified as acute or chronic, without mention of hemorrhage, perforation, or obstruction   . Gouty arthropathy, unspecified   . Blood in stool   . Memory loss   . Mitral valve disorders   . Benign localized hyperplasia of prostate with urinary obstruction and other lower urinary tract symptoms (LUTS)(600.21)   . Urinary obstruction, not elsewhere classified   . Varicose veins of lower extremities with inflammation   . Slowing of urinary stream    Past Surgical History  Procedure Laterality Date  . Tonsillectomy     . Joint replacement    . Pacemaker insertion    . Cataract extraction w/ intraocular lens implant     No family history on file. History  Substance Use Topics  . Smoking status: Never Smoker   . Smokeless tobacco: Never Used  . Alcohol Use: No    Review of Systems  Respiratory: Positive for shortness of breath.   Cardiovascular: Negative for chest pain.  All other systems reviewed and are negative.     Allergies  Review of patient's allergies indicates no known allergies.  Home Medications   Prior to Admission medications   Medication Sig Start Date End Date Taking? Authorizing Provider  aspirin 81 MG tablet Take 81 mg by mouth daily.      Historical Provider, MD  furosemide (LASIX) 40 MG tablet Take 40 mg by mouth daily.      Historical Provider, MD  metFORMIN (GLUCOPHAGE-XR) 500 MG 24 hr tablet Take 500 mg by mouth daily with breakfast.      Historical Provider, MD  metoprolol (TOPROL-XL) 50 MG 24 hr tablet Take 50 mg by mouth daily.      Historical Provider, MD  Multiple Vitamins-Minerals (MULTIVITAMIN WITH IRON-MINERALS) liquid Take by mouth daily.    Historical Provider, MD  pantoprazole (PROTONIX) 40 MG tablet Take 40 mg by mouth daily.    Historical Provider, MD  potassium chloride (KLOR-CON) 20 MEQ packet Take 20 mEq by mouth daily.      Historical Provider, MD  tamsulosin (FLOMAX) 0.4 MG CAPS capsule Take 0.4 mg by mouth  daily.    Historical Provider, MD  trandolapril (MAVIK) 2 MG tablet Take 2 mg by mouth daily.      Historical Provider, MD  triamcinolone cream (KENALOG) 0.1 % Apply 1 application topically 2 (two) times daily.    Historical Provider, MD   BP 118/65  Pulse 82  Temp(Src) 98 F (36.7 C) (Oral)  Resp 18  Wt 200 lb 9.6 oz (90.992 kg)  SpO2 98% Physical Exam  Nursing note and vitals reviewed. Constitutional: He is oriented to person, place, and time. He appears well-developed and well-nourished. No distress.  HENT:  Head: Normocephalic and  atraumatic.  Mouth/Throat: Oropharynx is clear and moist.  Neck: Normal range of motion. Neck supple.  Cardiovascular: Normal rate, regular rhythm and normal heart sounds.   No murmur heard. Pulmonary/Chest: Effort normal. No respiratory distress. He has no wheezes. He has rales.  There are slight rales in the bases bilaterally.  Abdominal: Soft. Bowel sounds are normal. He exhibits no distension. There is no tenderness.  Musculoskeletal: Normal range of motion. He exhibits edema.  There are chronic changes of lymphedema noted in the legs. There is slight edema noted as well.  Neurological: He is alert and oriented to person, place, and time.  Skin: Skin is warm and dry. He is not diaphoretic.    ED Course  Procedures (including critical care time) Labs Review Labs Reviewed  CBC - Abnormal; Notable for the following:    RBC 3.37 (*)    Hemoglobin 7.2 (*)    HCT 24.4 (*)    MCV 72.4 (*)    MCH 21.4 (*)    MCHC 29.5 (*)    RDW 21.3 (*)    All other components within normal limits  BASIC METABOLIC PANEL - Abnormal; Notable for the following:    Glucose, Bld 132 (*)    BUN 35 (*)    GFR calc non Af Amer 46 (*)    GFR calc Af Amer 54 (*)    All other components within normal limits  PRO B NATRIURETIC PEPTIDE  TROPONIN I    Imaging Review No results found.   EKG Interpretation   Date/Time:  Wednesday March 31 2014 17:51:02 EDT Ventricular Rate:  81 PR Interval:  286 QRS Duration: 100 QT Interval:  420 QTC Calculation: 487 R Axis:   86 Text Interpretation:  Sinus rhythm with 1st degree A-V block with  occasional Premature ventricular complexes ST \\T \ T wave abnormality,  consider inferolateral ischemia Abnormal ECG Confirmed by DELOS  MD,  Keilly Fatula ([id number]) on 03/31/2014 6:42:46 PM      MDM   Final diagnoses:  None    Patient presents with complaints of dyspnea on exertion for the past several days. This appears to be related to his hemoglobin of 7.2. He is  chronically anemic, however this is lower than his baseline. There may be some delusional effect as he reports more edema to his legs and his BNP is 1200, however I feel is that he will likely require diuresis and red blood cell transfusion. I've discussed the case with Dr. Ernestina Patches who will evaluate patient in the ER and determine the final disposition.    Veryl Speak, MD 03/31/14 1943    The patient will be admitted to the hospitalist service. While being evaluated by Dr. Ernestina Patches, the patient admitted to some exertional chest discomfort as well as difficulty breathing. For this reason, Dr. Ernestina Patches would like to have cardiology informed of the patient's symptoms.  Nathaneil Canary  Laraina Sulton, MD 03/31/14 2014

## 2014-03-31 NOTE — ED Notes (Signed)
The pt was sent here by his doctor for chf from the office.  He has had sob and extra fluid every where.  No pain anywhere.  Not sob at present

## 2014-03-31 NOTE — H&P (Signed)
Hospitalist Admission History and Physical  Patient name: [name] [name] Medical record number: [id number] Date of birth: 18-May-1924 Age: 80 y.o. Gender: male  Primary Care Provider: Arlyss Repress, MD  Chief Complaint: dyspnea  History of Present Illness:This is a 80 y.o. year old male with significant past medical history of HTN, afib-not on anticoagulation s/p pacemaker, unspecified CHF  presenting with dyspnea. Pt states that he has had progressive dyspnea over the past 6 months. Has been evaluated by cardiology over this time frame. There as concern for valvular disease vs. Afib. Pt was not continued on anticoagulation. Pt also had episode of symptomatic anemia that required hosptialization at [facility]. Had + hemoccult per wife. Was transfuse 2 units pRBCs w/ resolution of sxs. Also had normal endoscopy. Colonoscopy was initially planned, but then tabled as bloody stools resolved. Wife states that pt has had DOE and chest pressure over past 2 weeks. Pt states that he feels severely winded if he walks > 50 yards. States CP is predominantly with exertion. Central in nature. Relieved with rest. Currently taking baby asa. Denies melena.  On presentation to the ER, hemodynamically stable, afebrile. WBC 6, Hgb 7.2, Cr 1.33, BUN 35. CXR w/ cardiomegaly and pulm vasc congestion. ProBNP 1200. Trop WNL x1. EKG w/ non specific t wave changes in setting of 1st degree AV block.   02/17/2014 Cards office visit weight: 84 kg  Adm weight: 91 kg   Assessment and Plan: [name] [name] is a 80 y.o. year old male presenting with Dyspnea, chest pain, sympomatic anemia   Active Problems:   Dyspnea   Symptomatic anemia   Chest pain   1-Dyspnea/symptomatic anemia  -Likely multifactorial with contributions of CHF and symptomatic anemia  -Diurese pt.  -2 units pRBC transfusion w/ IV lasix  -f/u on hemoccult-GI consult as  -trend weight and fluid balance  -tele bed   2-Chest Pain  -somewhat typical sxs  -?  Mild demand mismatch in setting of above  -trop WNL, EKG non specific -continue baby asa  -no active CP currently  -cards c/s -Cycle CEs  -risk stratification labs   3-Afib/HTN -sinus rhythm -tele bed  -f/u cards c/s -cont home regimen   4-DM -SSI, A1c  FEN/GI: heart healthy/carb modified  Prophylaxis: sub q heparin  Disposition: pending further evaluation Code Status:Full Code   Patient Active Problem List   Diagnosis Date Noted  . Dyspnea 03/31/2014   Past Medical History: Past Medical History  Diagnosis Date  . Diabetes mellitus   . Atrial fibrillation   . Varicose vein of leg   . Hypertension   . Anemia, unspecified   . Other and unspecified angina pectoris   . Chest pain, unspecified   . Gastric ulcer, unspecified as acute or chronic, without mention of hemorrhage, perforation, or obstruction   . Gouty arthropathy, unspecified   . Blood in stool   . Memory loss   . Mitral valve disorders   . Benign localized hyperplasia of prostate with urinary obstruction and other lower urinary tract symptoms (LUTS)(600.21)   . Urinary obstruction, not elsewhere classified   . Varicose veins of lower extremities with inflammation   . Slowing of urinary stream     Past Surgical History: Past Surgical History  Procedure Laterality Date  . Tonsillectomy    . Joint replacement    . Pacemaker insertion    . Cataract extraction w/ intraocular lens implant      Social History: History   Social History  . Marital Status:  Married    Spouse Name: N/A    Number of Children: N/A  . Years of Education: N/A   Social History Main Topics  . Smoking status: Never Smoker   . Smokeless tobacco: Never Used  . Alcohol Use: No  . Drug Use: No  . Sexual Activity: None   Other Topics Concern  . None   Social History Narrative  . None    Family History: No family history on file.  Allergies: No Known Allergies  Current Facility-Administered Medications  Medication  Dose Route Frequency Provider Last Rate Last Dose  . 0.9 %  sodium chloride infusion  250 mL Intravenous PRN Shanda Howells, MD      . aspirin EC tablet 81 mg  81 mg Oral Daily Shanda Howells, MD      . furosemide (LASIX) injection 20 mg  20 mg Intravenous Once Shanda Howells, MD      . furosemide (LASIX) injection 40 mg  40 mg Intravenous Q12H Shanda Howells, MD      . insulin aspart (novoLOG) injection 0-9 Units  0-9 Units Subcutaneous 6 times per day Shanda Howells, MD      . metoprolol succinate (TOPROL-XL) 24 hr tablet 50 mg  50 mg Oral Daily Shanda Howells, MD      . multivitamin with minerals tablet 1 tablet  1 tablet Oral Daily Shanda Howells, MD      . pantoprazole (PROTONIX) EC tablet 40 mg  40 mg Oral Daily Shanda Howells, MD      . sodium chloride 0.9 % injection 3 mL  3 mL Intravenous Q12H Shanda Howells, MD      . sodium chloride 0.9 % injection 3 mL  3 mL Intravenous Q12H Shanda Howells, MD      . sodium chloride 0.9 % injection 3 mL  3 mL Intravenous PRN Shanda Howells, MD      . tamsulosin (FLOMAX) capsule 0.4 mg  0.4 mg Oral QHS Shanda Howells, MD      . trandolapril (MAVIK) tablet 4 mg  4 mg Oral Daily Shanda Howells, MD      . triamcinolone cream (KENALOG) 0.1 % 1 application  1 application Topical BID BM & HS PRN Shanda Howells, MD       Current Outpatient Prescriptions  Medication Sig Dispense Refill  . aspirin EC 81 MG tablet Take 81 mg by mouth daily.      . furosemide (LASIX) 40 MG tablet Take 40 mg by mouth daily.        . metFORMIN (GLUCOPHAGE-XR) 500 MG 24 hr tablet Take 500 mg by mouth daily.       . metoprolol (TOPROL-XL) 50 MG 24 hr tablet Take 50 mg by mouth daily.        . Multiple Vitamin (MULTIVITAMIN WITH MINERALS) TABS tablet Take 1 tablet by mouth daily. Centrum Silver      . pantoprazole (PROTONIX) 40 MG tablet Take 40 mg by mouth daily.      . tamsulosin (FLOMAX) 0.4 MG CAPS capsule Take 0.4 mg by mouth at bedtime.       . trandolapril (MAVIK) 4 MG tablet Take 4 mg  by mouth daily.      Marland Kitchen triamcinolone cream (KENALOG) 0.1 % Apply 1 application topically 3 times/day as needed-between meals & bedtime (itching/irritation).        Review Of Systems: 12 point ROS negative except as noted above in HPI.  Physical Exam: Filed Vitals:   03/31/14 1945  BP: 138/69  Pulse: 85  Temp:   Resp:     General: alert and cooperative HEENT: PERRLA and extra ocular movement intact Heart: S1, S2 normal, no murmur, rub or gallop, regular rate and rhythm Lungs: clear to auscultation, no wheezes or rales and unlabored breathing Abdomen: abdomen is soft without significant tenderness, masses, organomegaly or guarding Extremities: extremities normal, atraumatic, no cyanosis or edema Skin:no rashes, no ecchymoses Neurology: normal without focal findings  Labs and Imaging: Lab Results  Component Value Date/Time   NA 142 03/31/2014  6:00 PM   K 3.7 03/31/2014  6:00 PM   CL 100 03/31/2014  6:00 PM   CO2 28 03/31/2014  6:00 PM   BUN 35* 03/31/2014  6:00 PM   CREATININE 1.33 03/31/2014  6:00 PM   GLUCOSE 132* 03/31/2014  6:00 PM   Lab Results  Component Value Date   WBC 6.0 03/31/2014   HGB 7.2* 03/31/2014   HCT 24.4* 03/31/2014   MCV 72.4* 03/31/2014   PLT 158 03/31/2014    Dg Chest 2 View  03/31/2014   CLINICAL DATA:  Congestive heart failure.  EXAM: CHEST  2 VIEW  COMPARISON:  11/12/2006 chest radiographs from [facility] [place].  FINDINGS: Left subclavian pacemaker lead appears unchanged at the right ventricular apex. The heart is enlarged. There is chronic vascular congestion without overt pulmonary edema. There are new small bilateral pleural effusions associated with linear bibasilar atelectasis. There is no confluent airspace opacity. The bones are demineralized without acute findings. There is a stable chondroid lesion within the left humeral head.  IMPRESSION: Cardiomegaly with vascular congestion, small pleural effusions and mild bibasilar atelectasis.  No overt pulmonary edema.   Electronically Signed   By: Camie Patience M.D.   On: 03/31/2014 18:43           Shanda Howells MD  Pager: [phone]

## 2014-03-31 NOTE — Consult Note (Signed)
CARDIOLOGY CONSULT NOTE  Patient ID: [name] [name], MRN: [id number], DOB/AGE: November 18, 1923 80 y.o. Admit date: 03/31/2014 Date of Consult: 03/31/2014  Primary Physician: Arlyss Repress, MD Primary Cardiologist: Dr. Caryl Comes  Chief Complaint: SOB Reason for Consultation: CHF and chest pain  HPI: 80 y.o. male w/ PMHx significant for tachybrady s/p pacer, CHF with nl EF by echo 03/2014, microcytic anemia, HTN, DM2 who presented to [facility] on 03/31/2014 with complaints of shortness of breath. [name] who is at bedside. He was last seen by Dr. Caryl Comes approx 2 months ago and at that time, he reports symptoms of CHF (dyspnea on exertion, LE swelling) but was still able to ambulate to and from the dr's office. Now, he is much more profoundly short of breath and with minimal exertion, gets winded and has chest discomfort that is relieved with rest. After Dr. Olin Pia visit, underwent echo(EF 50%, dilated LV, severe LAE, mod severe TR with RVSP of 74 mmHg, RAE, RV dysfunction). No recent changes in his medications, no adjustments to his diuretic.  His recent history has been complicated by microcytic anemia which the source is undetermined. In the last 6 months, underwent upper endoscopy (sigmoidoscopy 10-15 yrs ago). Per wife, his GI doc was ambivalent regarding doing a colonoscopy and the rectal bleeding stopped, so no further scoping was performed.   Endorses enlarging abdomen and significant scrotal swelling which has limited his ability to urinate. LE swelling is worse. Does not monitor weights though they report he is actually loosing weight.  Denies fevers, chills, chest pain at rest. No melena, BRBPR, or n/v.  Past Medical History  Diagnosis Date  . Diabetes mellitus   . Atrial fibrillation   . Varicose vein of leg   . Hypertension   . Anemia, unspecified   . Other and unspecified angina pectoris   . Chest pain, unspecified   . Gastric ulcer, unspecified as acute  or chronic, without mention of hemorrhage, perforation, or obstruction   . Gouty arthropathy, unspecified   . Blood in stool   . Memory loss   . Mitral valve disorders   . Benign localized hyperplasia of prostate with urinary obstruction and other lower urinary tract symptoms (LUTS)(600.21)   . Urinary obstruction, not elsewhere classified   . Varicose veins of lower extremities with inflammation   . Slowing of urinary stream       Surgical History:  Past Surgical History  Procedure Laterality Date  . Tonsillectomy    . Joint replacement    . Pacemaker insertion    . Cataract extraction w/ intraocular lens implant       Home Meds: Prior to Admission medications   Medication Sig Start Date End Date Taking? Authorizing Provider  aspirin EC 81 MG tablet Take 81 mg by mouth daily.   Yes Historical Provider, MD  furosemide (LASIX) 40 MG tablet Take 40 mg by mouth daily.     Yes Historical Provider, MD  metFORMIN (GLUCOPHAGE-XR) 500 MG 24 hr tablet Take 500 mg by mouth daily.    Yes Historical Provider, MD  metoprolol (TOPROL-XL) 50 MG 24 hr tablet Take 50 mg by mouth daily.     Yes Historical Provider, MD  Multiple Vitamin (MULTIVITAMIN WITH MINERALS) TABS tablet Take 1 tablet by mouth daily. Centrum Silver   Yes Historical Provider, MD  pantoprazole (PROTONIX) 40 MG tablet Take 40 mg by mouth daily.   Yes Historical Provider, MD  tamsulosin (FLOMAX) 0.4 MG CAPS capsule Take 0.4  mg by mouth at bedtime.    Yes Historical Provider, MD  trandolapril (MAVIK) 4 MG tablet Take 4 mg by mouth daily.   Yes Historical Provider, MD  triamcinolone cream (KENALOG) 0.1 % Apply 1 application topically 3 times/day as needed-between meals & bedtime (itching/irritation).    Yes Historical Provider, MD    Inpatient Medications:  . aspirin EC  81 mg Oral Daily  . furosemide  20 mg Intravenous Once  . furosemide  40 mg Intravenous Q12H  . insulin aspart  0-9 Units Subcutaneous 6 times per day  .  metoprolol succinate  50 mg Oral Daily  . multivitamin with minerals  1 tablet Oral Daily  . pantoprazole  40 mg Oral Daily  . sodium chloride  3 mL Intravenous Q12H  . sodium chloride  3 mL Intravenous Q12H  . tamsulosin  0.4 mg Oral QHS  . trandolapril  4 mg Oral Daily   . sodium chloride      Allergies: No Known Allergies  History   Social History  . Marital Status: Married    Spouse Name: N/A    Number of Children: N/A  . Years of Education: N/A   Occupational History  . Not on file.   Social History Main Topics  . Smoking status: Never Smoker   . Smokeless tobacco: Never Used  . Alcohol Use: No  . Drug Use: No  . Sexual Activity: Not on file   Other Topics Concern  . Not on file   Social History Narrative  . No narrative on file     No family history on file.   Review of Systems: General: negative for chills, fever, night sweats or weight changes.  Cardiovascular: see HPI Dermatological: negative for rash Respiratory: negative for cough or wheezing Urologic: negative for hematuria Abdominal: see HPI Neurologic: negative for visual changes, syncope, or dizziness All other systems reviewed and are otherwise negative except as noted above.  Labs:  Recent Labs  03/31/14 1800 03/31/14 2013  TROPONINI <0.30 <0.30   Lab Results  Component Value Date   WBC 6.0 03/31/2014   HGB 7.2* 03/31/2014   HCT 24.4* 03/31/2014   MCV 72.4* 03/31/2014   PLT 158 03/31/2014    Recent Labs Lab 03/31/14 1800  NA 142  K 3.7  CL 100  CO2 28  BUN 35*  CREATININE 1.33  CALCIUM 8.8  GLUCOSE 132*   No results found for this basename: CHOL, HDL, LDLCALC, TRIG   No results found for this basename: DDIMER    Radiology/Studies:  Dg Chest 2 View  03/31/2014   CLINICAL DATA:  Congestive heart failure.  EXAM: CHEST  2 VIEW  COMPARISON:  11/12/2006 chest radiographs from [facility] [place].  FINDINGS: Left subclavian pacemaker lead appears unchanged at the  right ventricular apex. The heart is enlarged. There is chronic vascular congestion without overt pulmonary edema. There are new small bilateral pleural effusions associated with linear bibasilar atelectasis. There is no confluent airspace opacity. The bones are demineralized without acute findings. There is a stable chondroid lesion within the left humeral head.  IMPRESSION: Cardiomegaly with vascular congestion, small pleural effusions and mild bibasilar atelectasis. No overt pulmonary edema.   Electronically Signed   By: Camie Patience M.D.   On: 03/31/2014 18:43    EKG: poor tracing, unable to determine atrial rhythm, regular V rate, narrow complex Physical Exam: Blood pressure 137/72, pulse 78, temperature 98 F (36.7 C), temperature source Oral, resp. rate  18, weight 90.992 kg (200 lb 9.6 oz), SpO2 96.00%. General: , in no acute distress. Head: Normocephalic, atraumatic, sclera non-icteric, no xanthomas, nares are without discharge.  Neck: Supple. Negative for carotid bruits. JVD at 15+ cm, engorged EJV. Lungs: silent at bases Heart: RRR, systolic murmur 2/6 RLSB Abdomen: distended, nontender, sig scrotal edema Msk:  Strength and tone appear normal for age. Extremities: No clubbing or cyanosis. +2-3 chronic edema in shins, extends to thighs.   Neuro: Alert and oriented X 3. Moves all extremities spontaneously. Psych:  Responds to questions appropriately with a normal affect, some mild cog dysfuinction evident   Assessment and Plan:  Problem List 1. Biventricular heart failure with L HFpEF and R systolic dysfunction, severe pulmonary hypertension, acute on chronic decompensation 2. Tricuspid regurg likely secondary to #1 3. Anemia, microcytic 4. HTN 5. DM2 6. H/o afib s/p pacer, sinus vs. Nodal escape 7. Chest pain, volume overload vs. Anemia vs. Undiagnosed atherosclerosis symptomatic due to CHF, anemia  80 y.o. male w/ PMHx significant for tachybrady s/p pacer, CHF with nl EF by echo  03/2014, microcytic anemia, HTN, DM2 who presented to [facility] on 03/31/2014 with complaints of shortness of breath --> clearly volume overloaded with biventricular symptoms and signs.   Biventricular failure with left sided symptoms (PND, orthopnea, effusions, cxray) and right sided (LE edema, GI swelling/protuberance and possible ascites). Trigger unclear but sounds gradual and is likely due to combination of issues including progressive myocardial dysfunction, valvular dysfunction and anemia. Dietary indiscretion also possible. Needs significant diuresis with goal of at least 2 L/day. With RV dysfunction though, kidneys may present as a limiting factor later on. Would continue beta blocker and ACEI. Needs education regarding avoiding salt and weight monitoring.  Agree with transfusion which may help with symptoms as well as alleviating anemia as a trigger for fluid retention.   Continue aspirin. If after transfusion and significant diuresis, continues to have angina, will address. Initial troponin negative.  H/o afib, no anticoagulation due to occult microcytic anemia. Consider lower endoscopy to pinpoint source.  Last pacer check 6/17 --> normal function.  Summary of recs: -continue IV diuresis as you are, goal -2 L, close monitoring of renal fxn and potassium -CHF education -continue BB and ACEI -transfuse with IV lasix as you are  Thank you for this consult. Please call with questions. Will follow up in the AM.  Signed, Nathalee Smarr C. MD 03/31/2014, 9:05 PM   \

## 2014-03-31 NOTE — ED Notes (Signed)
Internal medicine at bedside

## 2014-04-01 ENCOUNTER — Encounter (HOSPITAL_COMMUNITY): Payer: Self-pay | Admitting: Interventional Cardiology

## 2014-04-01 DIAGNOSIS — N401 Enlarged prostate with lower urinary tract symptoms: Secondary | ICD-10-CM | POA: Diagnosis present

## 2014-04-01 DIAGNOSIS — K259 Gastric ulcer, unspecified as acute or chronic, without hemorrhage or perforation: Secondary | ICD-10-CM | POA: Diagnosis present

## 2014-04-01 DIAGNOSIS — I517 Cardiomegaly: Secondary | ICD-10-CM | POA: Diagnosis present

## 2014-04-01 DIAGNOSIS — I5033 Acute on chronic diastolic (congestive) heart failure: Secondary | ICD-10-CM | POA: Diagnosis present

## 2014-04-01 DIAGNOSIS — Z7982 Long term (current) use of aspirin: Secondary | ICD-10-CM | POA: Diagnosis not present

## 2014-04-01 DIAGNOSIS — I509 Heart failure, unspecified: Secondary | ICD-10-CM | POA: Diagnosis present

## 2014-04-01 DIAGNOSIS — M109 Gout, unspecified: Secondary | ICD-10-CM | POA: Diagnosis present

## 2014-04-01 DIAGNOSIS — I10 Essential (primary) hypertension: Secondary | ICD-10-CM | POA: Diagnosis present

## 2014-04-01 DIAGNOSIS — R0989 Other specified symptoms and signs involving the circulatory and respiratory systems: Secondary | ICD-10-CM

## 2014-04-01 DIAGNOSIS — I059 Rheumatic mitral valve disease, unspecified: Secondary | ICD-10-CM | POA: Diagnosis present

## 2014-04-01 DIAGNOSIS — I440 Atrioventricular block, first degree: Secondary | ICD-10-CM | POA: Diagnosis present

## 2014-04-01 DIAGNOSIS — Z950 Presence of cardiac pacemaker: Secondary | ICD-10-CM | POA: Diagnosis not present

## 2014-04-01 DIAGNOSIS — N139 Obstructive and reflux uropathy, unspecified: Secondary | ICD-10-CM | POA: Diagnosis present

## 2014-04-01 DIAGNOSIS — I5032 Chronic diastolic (congestive) heart failure: Secondary | ICD-10-CM | POA: Diagnosis present

## 2014-04-01 DIAGNOSIS — R0609 Other forms of dyspnea: Secondary | ICD-10-CM

## 2014-04-01 DIAGNOSIS — I079 Rheumatic tricuspid valve disease, unspecified: Secondary | ICD-10-CM | POA: Diagnosis present

## 2014-04-01 DIAGNOSIS — Z9660 Presence of unspecified orthopedic joint implant: Secondary | ICD-10-CM | POA: Diagnosis not present

## 2014-04-01 DIAGNOSIS — D509 Iron deficiency anemia, unspecified: Secondary | ICD-10-CM | POA: Diagnosis present

## 2014-04-01 DIAGNOSIS — E119 Type 2 diabetes mellitus without complications: Secondary | ICD-10-CM | POA: Diagnosis present

## 2014-04-01 LAB — CBC
HEMATOCRIT: 29.1 % — AB (ref 39.0–52.0)
Hemoglobin: 8.9 g/dL — ABNORMAL LOW (ref 13.0–17.0)
MCH: 22.8 pg — ABNORMAL LOW (ref 26.0–34.0)
MCHC: 30.6 g/dL (ref 30.0–36.0)
MCV: 74.6 fL — ABNORMAL LOW (ref 78.0–100.0)
PLATELETS: 137 10*3/uL — AB (ref 150–400)
RBC: 3.9 MIL/uL — ABNORMAL LOW (ref 4.22–5.81)
RDW: 20.3 % — AB (ref 11.5–15.5)
WBC: 5.8 10*3/uL (ref 4.0–10.5)

## 2014-04-01 LAB — GLUCOSE, CAPILLARY
GLUCOSE-CAPILLARY: 111 mg/dL — AB (ref 70–99)
Glucose-Capillary: 115 mg/dL — ABNORMAL HIGH (ref 70–99)
Glucose-Capillary: 145 mg/dL — ABNORMAL HIGH (ref 70–99)
Glucose-Capillary: 152 mg/dL — ABNORMAL HIGH (ref 70–99)
Glucose-Capillary: 106 mg/dL — ABNORMAL HIGH (ref 70–99)

## 2014-04-01 LAB — PREALBUMIN: PREALBUMIN: 15.1 mg/dL — AB (ref 17.0–34.0)

## 2014-04-01 LAB — TROPONIN I: Troponin I: <0.3 ng/mL (ref <0.30)

## 2014-04-01 LAB — HEMOGLOBIN A1C
Hgb A1c MFr Bld: 6.9 % — ABNORMAL HIGH (ref <5.7)
Mean Plasma Glucose: 151 mg/dL — ABNORMAL HIGH (ref <117)

## 2014-04-01 MED ORDER — FUROSEMIDE 10 MG/ML IJ SOLN
40.0000 mg | Freq: Once | INTRAMUSCULAR | Status: AC
  Administered 2014-04-01: 40 mg via INTRAVENOUS

## 2014-04-01 MED ORDER — INSULIN ASPART 100 UNIT/ML ~~LOC~~ SOLN
0.0000 [IU] | Freq: Three times a day (TID) | SUBCUTANEOUS | Status: DC
  Administered 2014-04-01 – 2014-04-03 (×3): 2 [IU] via SUBCUTANEOUS
  Administered 2014-04-03: 1 [IU] via SUBCUTANEOUS

## 2014-04-01 MED ORDER — INSULIN ASPART 100 UNIT/ML ~~LOC~~ SOLN: 0.0000 [IU] | Freq: Three times a day (TID) | SUBCUTANEOUS | Status: DC

## 2014-04-01 MED ORDER — INSULIN ASPART 100 UNIT/ML ~~LOC~~ SOLN: 0.0000 [IU] | Freq: Every day | SUBCUTANEOUS | Status: DC

## 2014-04-01 NOTE — ED Provider Notes (Signed)
Medical screening examination/treatment/procedure(s) were performed by non-physician practitioner and as supervising physician I was immediately available for consultation/collaboration.   EKG Interpretation   Date/Time:  Wednesday March 31 2014 17:51:02 EDT Ventricular Rate:  81 PR Interval:  286 QRS Duration: 100 QT Interval:  420 QTC Calculation: 487 R Axis:   86 Text Interpretation:  Sinus rhythm with 1st degree A-V block with  occasional Premature ventricular complexes ST \\T \ T wave abnormality,  consider inferolateral ischemia Abnormal ECG Confirmed by Beau Fanny  MD,  Alexianna Nachreiner ([id number]) on 03/31/2014 6:42:46 PM       Veryl Speak, MD 04/01/14 [phone]

## 2014-04-01 NOTE — Progress Notes (Signed)
[facility] PROGRESS NOTE  [name] [name] EVO:[id number] DOB: 1924-09-02 DOA: 03/31/2014 PCP: Arlyss Repress, MD  Assessment/Plan: 1. Acute on chronic biventricular congestive heart failure -Last transthoracic echocardiogram performed on 03/09/2014 at which time he had a preserved ejection fraction 50-55% with right ventricular cavity size moderately dilated, systolic function moderately reduced with atrial beats are dilated. There is moderate to severe tricuspid regurgitation. -Cardiology consulted -Status post transfusion with 2 units packed red blood cells as he presented with hemoglobin of 7.2 -Continue IV diuresis with Lasix 40 mg IV twice a day -Follow strict ins and outs and daily weights  2. Acute on chronic anemia -Patient presenting with a hemoglobin of 7.2 for which she was typed and cross and transfuse 2 units packed red blood cells -He denies bright red blood per rectum, black tarry stools or hematemesis -His wife reporting that he underwent GI workup at [facility] with EGD -Colonoscopy had been considered, however, per wife this was not done as her and her husband preferred to monitor conservatively -I discussed GI consultation for colonoscopy with patient and family members, they do not wish to pursue invasive GI procedures now, and plan to reassess this as an outpatient -Repeat hemoglobin 8.9  3. History of atrial fibrillation  -Currently in SR -Continue metoprolol 50 mg daily -Not a anticoagulation candidate due to history of anemia  4. Type 2 diabetes mellitus -Hemoglobin A1c of 6.9 -Blood sugars are 150's -Continue Accu-Cheks q. a.c. and each bedtime with sliding scale  Code Status: Full Code Family Communication: Spoke to patient's wife present at bedside Disposition Plan: IV diuresis. Supportive care, follow up on H&H   Consultants:  Cardiology   HPI/Subjective: Patient is an 80 year old with past medical history of CHF with his last transthoracic  echocardiogram performed on 03/09/2014 which showed an ejection fraction 50-55%, right ventricular cavity size was moderately dilatated, systolic function moderately reduced, atrium was severely dilated, moderate to severe tricuspid regurgitation. He presented to the emergency department on 03/31/2049 with complaints of increasing cough, shortness of breath and extremity edema. He was admitted to medicine service and started on IV diuresis for acute decompensated congestive heart failure. Patient was found to be anemic having a hemoglobin of 7.3 for which she was typed and crossed and transfused with 2 units of packed red blood cells. Cardiology consulted.   Objective: Filed Vitals:   04/01/14 0927  BP: 121/64  Pulse: 75  Temp:   Resp:     Intake/Output Summary (Last 24 hours) at 04/01/14 1350 Last data filed at 04/01/14 1217  Gross per 24 hour  Intake    610 ml  Output    575 ml  Net     35 ml   Filed Weights   03/31/14 1803 03/31/14 2118 04/01/14 0630  Weight: 90.992 kg (200 lb 9.6 oz) 89.903 kg (198 lb 3.2 oz) 90.13 kg (198 lb 11.2 oz)    Exam:   General:  Patient reports feeling about the same  Cardiovascular: Regular rate and rhythm, normal S1S2  Respiratory: Patient having bibasilar crackles, positive rales  Abdomen: Soft nontender nondistended  Musculoskeletal: Has 2+ edema  Data Reviewed: Basic Metabolic Panel:  Recent Labs Lab 03/31/14 1800  NA 142  K 3.7  CL 100  CO2 28  GLUCOSE 132*  BUN 35*  CREATININE 1.33  CALCIUM 8.8   Liver Function Tests: No results found for this basename: AST, ALT, ALKPHOS, BILITOT, PROT, ALBUMIN,  in the last 168 hours No results found for  this basename: LIPASE, AMYLASE,  in the last 168 hours No results found for this basename: AMMONIA,  in the last 168 hours CBC:  Recent Labs Lab 03/31/14 1800 04/01/14 0833  WBC 6.0 5.8  HGB 7.2* 8.9*  HCT 24.4* 29.1*  MCV 72.4* 74.6*  PLT 158 137*   Cardiac Enzymes:  Recent  Labs Lab 03/31/14 1800 03/31/14 2013 04/01/14 0833  TROPONINI <0.30 <0.30 <0.30   BNP (last 3 results)  Recent Labs  03/31/14 1800  PROBNP 1194.0*   CBG:  Recent Labs Lab 03/31/14 2128 04/01/14 04/01/14 0643 04/01/14 0831 04/01/14 1112  GLUCAP 105* 111* 115* 145* 152*    No results found for this or any previous visit (from the past 240 hour(s)).   Studies: Dg Chest 2 View  03/31/2014   CLINICAL DATA:  Congestive heart failure.  EXAM: CHEST  2 VIEW  COMPARISON:  11/12/2006 chest radiographs from [place].  FINDINGS: Left subclavian pacemaker lead appears unchanged at the right ventricular apex. The heart is enlarged. There is chronic vascular congestion without overt pulmonary edema. There are new small bilateral pleural effusions associated with linear bibasilar atelectasis. There is no confluent airspace opacity. The bones are demineralized without acute findings. There is a stable chondroid lesion within the left humeral head.  IMPRESSION: Cardiomegaly with vascular congestion, small pleural effusions and mild bibasilar atelectasis. No overt pulmonary edema.   Electronically Signed   By: Camie Patience M.D.   On: 03/31/2014 18:43    Scheduled Meds: . aspirin EC  81 mg Oral Daily  . furosemide  20 mg Intravenous Once  . furosemide  40 mg Intravenous BID  . insulin aspart  0-5 Units Subcutaneous QHS  . insulin aspart  0-9 Units Subcutaneous TID WC  . metoprolol succinate  50 mg Oral Daily  . multivitamin with minerals  1 tablet Oral Daily  . pantoprazole  40 mg Oral Daily  . sodium chloride  3 mL Intravenous Q12H  . sodium chloride  3 mL Intravenous Q12H  . tamsulosin  0.4 mg Oral QHS  . trandolapril  4 mg Oral Daily   Continuous Infusions:   Active Problems:   Dyspnea   Symptomatic anemia   Chest pain   Acute on chronic diastolic heart failure   CHF (congestive heart failure)    Time spent: 35 min    Kelvin Cellar  [facility] Pager [phone]. If 7PM-7AM, please contact night-coverage at [email or web address], password [facility] 04/01/2014, 1:50 PM  LOS: 1 day

## 2014-04-01 NOTE — Progress Notes (Signed)
Pt and wife would agreed that metoprolol can be given tonight .   Verbalized understanding and will continue to monitor pt,  Karie Kirks, Therapist, sports.

## 2014-04-01 NOTE — Care Management Note (Addendum)
  Page 1 of 1   04/01/2014     3:35:49 PM CARE MANAGEMENT NOTE 04/01/2014  Patient:  [name],[name]   Account Number:  [id number]  Date Initiated:  04/01/2014  Documentation initiated by:  Mariann Laster  Subjective/Objective Assessment:   CHF     Action/Plan:   CM to follow for disposition needs   Anticipated DC Date:  04/04/2014   Anticipated DC Plan:  HOME/SELF CARE         Choice offered to / List presented to:             Status of service:  In process, will continue to follow Medicare Important Message given?   (If response is "NO", the following Medicare IM given date fields will be blank) Date Medicare IM given:   Medicare IM given by:   Date Additional Medicare IM given:   Additional Medicare IM given by:    Discharge Disposition:    Per UR Regulation:    If discussed at Long Length of Stay Meetings, dates discussed:    Comments:  Gardiner Espana RN, BSN, MSHL, CCM  Nurse - Case Manager,  ([facility])  [phone]  04/01/2014 Initial IM 03/31/2014 provided by admissions. Dispositon Plan:  pending

## 2014-04-01 NOTE — Progress Notes (Signed)
SUBJECTIVE:  Still short of breath. He has not had to urinate a lot despite Lasix being given. He tolerated his 2 unit transfusion yesterday.  OBJECTIVE:   Vitals:   Filed Vitals:   04/01/14 0530 04/01/14 0630 04/01/14 0726 04/01/14 0927  BP: 114/46 119/54 116/59 121/64  Pulse: 73 61 75 75  Temp: 97.5 F (36.4 C) 97.3 F (36.3 C) 97.2 F (36.2 C)   TempSrc: Oral Oral Oral   Resp: 20  18   Height:      Weight:  198 lb 11.2 oz (90.13 kg)    SpO2: 99% 98% 99%    I&O's:   Intake/Output Summary (Last 24 hours) at 04/01/14 1137 Last data filed at 04/01/14 1109  Gross per 24 hour  Intake    610 ml  Output    375 ml  Net    235 ml   TELEMETRY: Reviewed telemetry pt in NSR:     PHYSICAL EXAM General: Well developed, well nourished, in no acute distress Head:   Normal cephalic and atramatic  Lungs:   Bibasilar crackles Heart:   HRRR S1 S2     Abdomen: abdomen soft and non-tender Msk:  Back normal,  Normal strength and tone for age. Extremities:  1+ bilateral pitting edema.   Neuro: Alert and oriented. Psych:  Normal affect, responds appropriately   LABS: Basic Metabolic Panel:  Recent Labs  03/31/14 1800  NA 142  K 3.7  CL 100  CO2 28  GLUCOSE 132*  BUN 35*  CREATININE 1.33  CALCIUM 8.8   Liver Function Tests: No results found for this basename: AST, ALT, ALKPHOS, BILITOT, PROT, ALBUMIN,  in the last 72 hours No results found for this basename: LIPASE, AMYLASE,  in the last 72 hours CBC:  Recent Labs  03/31/14 1800 04/01/14 0833  WBC 6.0 5.8  HGB 7.2* 8.9*  HCT 24.4* 29.1*  MCV 72.4* 74.6*  PLT 158 137*   Cardiac Enzymes:  Recent Labs  03/31/14 1800 03/31/14 2013 04/01/14 0833  TROPONINI <0.30 <0.30 <0.30   BNP: No components found with this basename: POCBNP,  D-Dimer: No results found for this basename: DDIMER,  in the last 72 hours Hemoglobin A1C:  Recent Labs  03/31/14 2013  HGBA1C 6.9*   Fasting Lipid Panel: No results found  for this basename: CHOL, HDL, LDLCALC, TRIG, CHOLHDL, LDLDIRECT,  in the last 72 hours Thyroid Function Tests: No results found for this basename: TSH, T4TOTAL, FREET3, T3FREE, THYROIDAB,  in the last 72 hours Anemia Panel: No results found for this basename: VITAMINB12, FOLATE, FERRITIN, TIBC, IRON, RETICCTPCT,  in the last 72 hours Coag Panel:   No results found for this basename: INR, PROTIME    RADIOLOGY: Dg Chest 2 View  03/31/2014   CLINICAL DATA:  Congestive heart failure.  EXAM: CHEST  2 VIEW  COMPARISON:  11/12/2006 chest radiographs [place] [facility] [place].  FINDINGS: Left subclavian pacemaker lead appears unchanged at the right ventricular apex. The heart is enlarged. There is chronic vascular congestion without overt pulmonary edema. There are new small bilateral pleural effusions associated with linear bibasilar atelectasis. There is no confluent airspace opacity. The bones are demineralized without acute findings. There is a stable chondroid lesion within the left humeral head.  IMPRESSION: Cardiomegaly with vascular congestion, small pleural effusions and mild bibasilar atelectasis. No overt pulmonary edema.   Electronically Signed   By: Camie Patience M.D.   On: 03/31/2014 18:43  ASSESSMENT: Kathyrn Lass:  Acute diastolic heart failure, anemia, moderate to severe tricuspid regurgitation  1) EF 50-55% earlier this month.   Will increase dose of Lasix today. He has not diuresed much despite transfusion and IV Lasix. His renal function is mildly decreased, likely due to age. He is still short of breath. I explained to the patient and his wife that he will have to stay in the hospital until her shortness of breath resolves. He typically does not use oxygen. We'll have to wean his oxygen as well. He may be in the hospital for several days.  Anemia: He is responded well to transfusion.  History of atrial fibrillation: He is not a good candidate for anticoagulation given his  anemia.    Jettie Booze., MD  04/01/2014  11:37 AM

## 2014-04-01 NOTE — Progress Notes (Addendum)
Pt;s wife requesting to speak with Dr. Coralyn Pear, as she think he is wheezing from metoprolol med he is taking.  Dr. Coralyn Pear made aware and spoke with pt's wife and instructed if pt refused med tonite ok to hold it.  Will continue to monitor.  Karie Kirks, RN,

## 2014-04-01 NOTE — Progress Notes (Signed)
Utilization Review Completed.Donne Anon T7/30/2015

## 2014-04-02 ENCOUNTER — Encounter (HOSPITAL_COMMUNITY): Payer: Self-pay | Admitting: General Practice

## 2014-04-02 LAB — CBC WITH DIFFERENTIAL/PLATELET
BASOS ABS: 0 10*3/uL (ref 0.0–0.1)
Basophils Relative: 0 % (ref 0–1)
Eosinophils Absolute: 0.1 10*3/uL (ref 0.0–0.7)
Eosinophils Relative: 2 % (ref 0–5)
HCT: 28.2 % — ABNORMAL LOW (ref 39.0–52.0)
HEMOGLOBIN: 8.4 g/dL — AB (ref 13.0–17.0)
LYMPHS PCT: 13 % (ref 12–46)
Lymphs Abs: 0.9 10*3/uL (ref 0.7–4.0)
MCH: 22.3 pg — ABNORMAL LOW (ref 26.0–34.0)
MCHC: 29.8 g/dL — ABNORMAL LOW (ref 30.0–36.0)
MCV: 74.8 fL — ABNORMAL LOW (ref 78.0–100.0)
MONO ABS: 1 10*3/uL (ref 0.1–1.0)
MONOS PCT: 15 % — AB (ref 3–12)
NEUTROS ABS: 5 10*3/uL (ref 1.7–7.7)
Neutrophils Relative %: 70 % (ref 43–77)
Platelets: 138 10*3/uL — ABNORMAL LOW (ref 150–400)
RBC: 3.77 MIL/uL — ABNORMAL LOW (ref 4.22–5.81)
RDW: 20.8 % — AB (ref 11.5–15.5)
WBC: 7.2 10*3/uL (ref 4.0–10.5)

## 2014-04-02 LAB — COMPREHENSIVE METABOLIC PANEL
ALBUMIN: 3.7 g/dL (ref 3.5–5.2)
ALT: 12 U/L (ref 0–53)
ANION GAP: 12 (ref 5–15)
AST: 27 U/L (ref 0–37)
Alkaline Phosphatase: 60 U/L (ref 39–117)
BUN: 29 mg/dL — AB (ref 6–23)
CO2: 31 mEq/L (ref 19–32)
CREATININE: 1.17 mg/dL (ref 0.50–1.35)
Calcium: 8.8 mg/dL (ref 8.4–10.5)
Chloride: 100 mEq/L (ref 96–112)
GFR calc Af Amer: 63 mL/min — ABNORMAL LOW (ref >90)
GFR calc non Af Amer: 54 mL/min — ABNORMAL LOW (ref >90)
Glucose, Bld: 88 mg/dL (ref 70–99)
Potassium: 3.8 mEq/L (ref 3.7–5.3)
Sodium: 143 mEq/L (ref 137–147)
TOTAL PROTEIN: 6.3 g/dL (ref 6.0–8.3)
Total Bilirubin: 1.9 mg/dL — ABNORMAL HIGH (ref 0.3–1.2)

## 2014-04-02 LAB — GLUCOSE, CAPILLARY
GLUCOSE-CAPILLARY: 166 mg/dL — AB (ref 70–99)
Glucose-Capillary: 96 mg/dL (ref 70–99)
Glucose-Capillary: 167 mg/dL — ABNORMAL HIGH (ref 70–99)
Glucose-Capillary: 107 mg/dL — ABNORMAL HIGH (ref 70–99)

## 2014-04-02 MED ORDER — FUROSEMIDE 80 MG PO TABS
80.0000 mg | ORAL_TABLET | Freq: Two times a day (BID) | ORAL | Status: DC
  Administered 2014-04-02 – 2014-04-03 (×2): 80 mg via ORAL
  Filled 2014-04-02 (×5): qty 1

## 2014-04-02 NOTE — Progress Notes (Signed)
SUBJECTIVE:  Shortness of breath much better today. Wife states that the patient was not cooperative last night and would do better at home.  OBJECTIVE:   Vitals:   Filed Vitals:   04/01/14 1446 04/01/14 2222 04/02/14 0610 04/02/14 0830  BP: 125/72 128/80 117/52 118/69  Pulse: 75 78 74 64  Temp: 97.3 F (36.3 C) 97.6 F (36.4 C) 97.7 F (36.5 C)   TempSrc: Oral Oral Oral   Resp: 18 20 20    Height:      Weight:   198 lb 2.5 oz (89.884 kg)   SpO2: 94% 100% 100%    I&O's:    Intake/Output Summary (Last 24 hours) at 04/02/14 0936 Last data filed at 04/02/14 7026  Gross per 24 hour  Intake    843 ml  Output   2425 ml  Net  -1582 ml   TELEMETRY: Reviewed telemetry pt in NSR:     PHYSICAL EXAM General: Well developed, well nourished, in no acute distress Head:   Normal cephalic and atramatic  Lungs:   CTA bilaterally Heart:   HRRR S1 S2     Abdomen: abdomen soft and non-tender Msk:  Back normal,  Normal strength and tone for age. Extremities:  1+ bilateral pitting edema.   Neuro: Alert and oriented. Psych:  Normal affect, responds appropriately   LABS: Basic Metabolic Panel:  Recent Labs  03/31/14 1800 04/02/14 0353  NA 142 143  K 3.7 3.8  CL 100 100  CO2 28 31  GLUCOSE 132* 88  BUN 35* 29*  CREATININE 1.33 1.17  CALCIUM 8.8 8.8   Liver Function Tests:  Recent Labs  04/02/14 0353  AST 27  ALT 12  ALKPHOS 60  BILITOT 1.9*  PROT 6.3  ALBUMIN 3.7   No results found for this basename: LIPASE, AMYLASE,  in the last 72 hours CBC:  Recent Labs  04/01/14 0833 04/02/14 0353  WBC 5.8 7.2  NEUTROABS  --  5.0  HGB 8.9* 8.4*  HCT 29.1* 28.2*  MCV 74.6* 74.8*  PLT 137* 138*   Cardiac Enzymes:  Recent Labs  03/31/14 1800 03/31/14 2013 04/01/14 0833  TROPONINI <0.30 <0.30 <0.30   BNP: No components found with this basename: POCBNP,  D-Dimer: No results found for this basename: DDIMER,  in the last 72 hours Hemoglobin A1C:  Recent  Labs  03/31/14 2013  HGBA1C 6.9*   Fasting Lipid Panel: No results found for this basename: CHOL, HDL, LDLCALC, TRIG, CHOLHDL, LDLDIRECT,  in the last 72 hours Thyroid Function Tests: No results found for this basename: TSH, T4TOTAL, FREET3, T3FREE, THYROIDAB,  in the last 72 hours Anemia Panel: No results found for this basename: VITAMINB12, FOLATE, FERRITIN, TIBC, IRON, RETICCTPCT,  in the last 72 hours Coag Panel:   No results found for this basename: INR,  PROTIME    RADIOLOGY: Dg Chest 2 View  03/31/2014   CLINICAL DATA:  Congestive heart failure.  EXAM: CHEST  2 VIEW  COMPARISON:  11/12/2006 chest radiographs [place].  FINDINGS: Left subclavian pacemaker lead appears unchanged at the right ventricular apex. The heart is enlarged. There is chronic vascular congestion without overt pulmonary edema. There are new small bilateral pleural effusions associated with linear bibasilar atelectasis. There is no confluent airspace opacity. The bones are demineralized without acute findings. There is a stable chondroid lesion within the left humeral head.  IMPRESSION: Cardiomegaly with vascular congestion, small pleural effusions and mild bibasilar atelectasis. No overt  pulmonary edema.   Electronically Signed   By: Camie Patience M.D.   On: 03/31/2014 18:43      ASSESSMENT: [name] [name]:  Acute diastolic heart failure, anemia, moderate to severe tricuspid regurgitation  1) EF 50-55% earlier this month.   Will change Lasix to oral today. He  diuresed well with IV Lasix. His renal function is mildly decreased, likely due to age.  I explained to the patient and his wife that he will have to stay in the hospital until her shortness of breath with exertion improves.  Resting SHOB is gone. He typically does not use oxygen. We'll have to wean his oxygen as well.   Anemia: He is responded well to transfusion. However Hbg dropped and there is no clear reason why this occurred.  WOuld  like to see that the trend on his Hbg is stable.  History of atrial fibrillation: He is not a good candidate for anticoagulation given his anemia.  Spoke at length with the wife regarding disposition.  She really wants him to go home but I think they will stay one more night.  THey are not interested in a colonoscopy to evaluate anemia.  He needs to ambulate.  They would like to be considered for discharge later today.    Jettie Booze., MD  04/02/2014  9:36 AM

## 2014-04-02 NOTE — Progress Notes (Signed)
[facility] PROGRESS NOTE  [name] [name] QBV:[id number] DOB: 06-12-1924 DOA: 03/31/2014 PCP: Arlyss Repress, MD  Assessment/Plan: 1. Acute on chronic biventricular congestive heart failure -Last transthoracic echocardiogram performed on 03/09/2014 at which time he had a preserved ejection fraction 50-55% with right ventricular cavity size moderately dilated, systolic function moderately reduced with atrial beats are dilated. There is moderate to severe tricuspid regurgitation. -Cardiology consulted -Status post transfusion with 2 units packed red blood cells as he presented with hemoglobin of 7.2 -Was transitioned to Lasix 80 mg PO BID -Follow strict ins and outs and daily weights  2. Acute on chronic anemia -Patient presenting with a hemoglobin of 7.2 for which she was typed and cross and transfuse 2 units packed red blood cells -He denies bright red blood per rectum, black tarry stools or hematemesis -His wife reporting that he underwent GI workup at [facility] with EGD -Colonoscopy had been considered, however, per wife this was not done as her and her husband preferred to monitor conservatively -I discussed GI consultation for colonoscopy with patient and family members, they do not wish to pursue invasive GI procedures now, and plan to reassess this as an outpatient -AM labs showing Hg of 8.4  3. History of atrial fibrillation  -Currently in SR -Continue metoprolol 50 mg daily -Not a anticoagulation candidate due to history of anemia  4. Type 2 diabetes mellitus -Hemoglobin A1c of 6.9 -Blood sugars are controlled -Continue Accu-Cheks q. a.c. and each bedtime with sliding scale  Code Status: Full Code Family Communication: Spoke to patient's wife present at bedside Disposition Plan: IV diuresis. Supportive care, follow up on H&H   Consultants:  Cardiology   HPI/Subjective: Patient is an 80 year old with past medical history of CHF with his last transthoracic  echocardiogram performed on 03/09/2014 which showed an ejection fraction 50-55%, right ventricular cavity size was moderately dilatated, systolic function moderately reduced, atrium was severely dilated, moderate to severe tricuspid regurgitation. He presented to the emergency department on 03/31/2049 with complaints of increasing cough, shortness of breath and extremity edema. He was admitted to medicine service and started on IV diuresis for acute decompensated congestive heart failure. Patient was found to be anemic having a hemoglobin of 7.3 for which she was typed and crossed and transfused with 2 units of packed red blood cells. Cardiology consulted.   Objective: Filed Vitals:   04/02/14 1437  BP: 116/57  Pulse: 74  Temp: 97.9 F (36.6 C)  Resp: 20    Intake/Output Summary (Last 24 hours) at 04/02/14 1649 Last data filed at 04/02/14 1050  Gross per 24 hour  Intake    483 ml  Output   2050 ml  Net  -1567 ml   Filed Weights   03/31/14 2118 04/01/14 0630 04/02/14 0610  Weight: 89.903 kg (198 lb 3.2 oz) 90.13 kg (198 lb 11.2 oz) 89.884 kg (198 lb 2.5 oz)    Exam:   General:  Patient reports feeling a little better today although not quite at his baseline  Cardiovascular: Regular rate and rhythm, normal S1S2  Respiratory: Patient having bibasilar crackles, positive rales  Abdomen: Soft nontender nondistended  Musculoskeletal: Has 2+ edema  Data Reviewed: Basic Metabolic Panel:  Recent Labs Lab 03/31/14 1800 04/02/14 0353  NA 142 143  K 3.7 3.8  CL 100 100  CO2 28 31  GLUCOSE 132* 88  BUN 35* 29*  CREATININE 1.33 1.17  CALCIUM 8.8 8.8   Liver Function Tests:  Recent Labs Lab 04/02/14 0353  AST 27  ALT 12  ALKPHOS 60  BILITOT 1.9*  PROT 6.3  ALBUMIN 3.7   No results found for this basename: LIPASE, AMYLASE,  in the last 168 hours No results found for this basename: AMMONIA,  in the last 168 hours CBC:  Recent Labs Lab 03/31/14 1800 04/01/14 0833  04/02/14 0353  WBC 6.0 5.8 7.2  NEUTROABS  --   --  5.0  HGB 7.2* 8.9* 8.4*  HCT 24.4* 29.1* 28.2*  MCV 72.4* 74.6* 74.8*  PLT 158 137* 138*   Cardiac Enzymes:  Recent Labs Lab 03/31/14 1800 03/31/14 2013 04/01/14 0833  TROPONINI <0.30 <0.30 <0.30   BNP (last 3 results)  Recent Labs  03/31/14 1800  PROBNP 1194.0*   CBG:  Recent Labs Lab 04/01/14 1112 04/01/14 1708 04/02/14 0615 04/02/14 1114 04/02/14 1646  GLUCAP 152* 106* 96 167* 107*    No results found for this or any previous visit (from the past 240 hour(s)).   Studies: Dg Chest 2 View  03/31/2014   CLINICAL DATA:  Congestive heart failure.  EXAM: CHEST  2 VIEW  COMPARISON:  11/12/2006 chest radiographs from [place].  FINDINGS: Left subclavian pacemaker lead appears unchanged at the right ventricular apex. The heart is enlarged. There is chronic vascular congestion without overt pulmonary edema. There are new small bilateral pleural effusions associated with linear bibasilar atelectasis. There is no confluent airspace opacity. The bones are demineralized without acute findings. There is a stable chondroid lesion within the left humeral head.  IMPRESSION: Cardiomegaly with vascular congestion, small pleural effusions and mild bibasilar atelectasis. No overt pulmonary edema.   Electronically Signed   By: Camie Patience M.D.   On: 03/31/2014 18:43    Scheduled Meds: . aspirin EC  81 mg Oral Daily  . furosemide  20 mg Intravenous Once  . furosemide  80 mg Oral BID  . insulin aspart  0-5 Units Subcutaneous QHS  . insulin aspart  0-9 Units Subcutaneous TID WC  . metoprolol succinate  50 mg Oral Daily  . multivitamin with minerals  1 tablet Oral Daily  . pantoprazole  40 mg Oral Daily  . sodium chloride  3 mL Intravenous Q12H  . sodium chloride  3 mL Intravenous Q12H  . tamsulosin  0.4 mg Oral QHS  . trandolapril  4 mg Oral Daily   Continuous Infusions:   Active Problems:   Dyspnea    Symptomatic anemia   Chest pain   Acute on chronic diastolic heart failure   CHF (congestive heart failure)    Time spent: 25 min    Kelvin Cellar  [facility] Pager [phone]. If 7PM-7AM, please contact night-coverage at [email or web address], password [facility] 04/02/2014, 4:49 PM  LOS: 2 days

## 2014-04-03 DIAGNOSIS — I079 Rheumatic tricuspid valve disease, unspecified: Secondary | ICD-10-CM

## 2014-04-03 DIAGNOSIS — I10 Essential (primary) hypertension: Secondary | ICD-10-CM

## 2014-04-03 DIAGNOSIS — I4891 Unspecified atrial fibrillation: Secondary | ICD-10-CM

## 2014-04-03 DIAGNOSIS — R079 Chest pain, unspecified: Secondary | ICD-10-CM

## 2014-04-03 DIAGNOSIS — I2789 Other specified pulmonary heart diseases: Secondary | ICD-10-CM

## 2014-04-03 LAB — CBC WITH DIFFERENTIAL/PLATELET
BASOS ABS: 0 10*3/uL (ref 0.0–0.1)
Basophils Relative: 0 % (ref 0–1)
Eosinophils Absolute: 0.1 10*3/uL (ref 0.0–0.7)
Eosinophils Relative: 2 % (ref 0–5)
HCT: 27.1 % — ABNORMAL LOW (ref 39.0–52.0)
Hemoglobin: 8.1 g/dL — ABNORMAL LOW (ref 13.0–17.0)
LYMPHS ABS: 0.8 10*3/uL (ref 0.7–4.0)
LYMPHS PCT: 11 % — AB (ref 12–46)
MCH: 22.6 pg — ABNORMAL LOW (ref 26.0–34.0)
MCHC: 29.9 g/dL — ABNORMAL LOW (ref 30.0–36.0)
MCV: 75.7 fL — AB (ref 78.0–100.0)
Monocytes Absolute: 1 10*3/uL (ref 0.1–1.0)
Monocytes Relative: 14 % — ABNORMAL HIGH (ref 3–12)
NEUTROS ABS: 4.8 10*3/uL (ref 1.7–7.7)
NEUTROS PCT: 72 % (ref 43–77)
PLATELETS: 126 10*3/uL — AB (ref 150–400)
RBC: 3.58 MIL/uL — AB (ref 4.22–5.81)
RDW: 21 % — ABNORMAL HIGH (ref 11.5–15.5)
WBC: 6.7 10*3/uL (ref 4.0–10.5)

## 2014-04-03 LAB — COMPREHENSIVE METABOLIC PANEL
ALT: 11 U/L (ref 0–53)
AST: 24 U/L (ref 0–37)
Albumin: 3.4 g/dL — ABNORMAL LOW (ref 3.5–5.2)
Alkaline Phosphatase: 58 U/L (ref 39–117)
Anion gap: 11 (ref 5–15)
BUN: 29 mg/dL — ABNORMAL HIGH (ref 6–23)
CALCIUM: 8.5 mg/dL (ref 8.4–10.5)
CO2: 32 mEq/L (ref 19–32)
CREATININE: 1.13 mg/dL (ref 0.50–1.35)
Chloride: 99 mEq/L (ref 96–112)
GFR calc non Af Amer: 56 mL/min — ABNORMAL LOW (ref >90)
GFR, EST AFRICAN AMERICAN: 65 mL/min — AB (ref >90)
Glucose, Bld: 94 mg/dL (ref 70–99)
Potassium: 3.1 mEq/L — ABNORMAL LOW (ref 3.7–5.3)
Sodium: 142 mEq/L (ref 137–147)
Total Bilirubin: 1.7 mg/dL — ABNORMAL HIGH (ref 0.3–1.2)
Total Protein: 5.9 g/dL — ABNORMAL LOW (ref 6.0–8.3)

## 2014-04-03 LAB — PREPARE RBC (CROSSMATCH)

## 2014-04-03 LAB — GLUCOSE, CAPILLARY
GLUCOSE-CAPILLARY: 108 mg/dL — AB (ref 70–99)
GLUCOSE-CAPILLARY: 151 mg/dL — AB (ref 70–99)
Glucose-Capillary: 129 mg/dL — ABNORMAL HIGH (ref 70–99)

## 2014-04-03 MED ORDER — FUROSEMIDE 10 MG/ML IJ SOLN
INTRAMUSCULAR | Status: AC
  Filled 2014-04-03: qty 4

## 2014-04-03 MED ORDER — FUROSEMIDE 40 MG PO TABS: 40.0000 mg | ORAL_TABLET | Freq: Two times a day (BID) | ORAL | Status: DC

## 2014-04-03 MED ORDER — POTASSIUM CHLORIDE ER 10 MEQ PO TBCR: 40.0000 meq | EXTENDED_RELEASE_TABLET | Freq: Two times a day (BID) | ORAL | Status: DC

## 2014-04-03 MED ORDER — FERROUS SULFATE 325 (65 FE) MG PO TABS
325.0000 mg | ORAL_TABLET | Freq: Two times a day (BID) | ORAL | Status: DC
  Administered 2014-04-03: 325 mg via ORAL
  Filled 2014-04-03 (×2): qty 1

## 2014-04-03 MED ORDER — POTASSIUM CHLORIDE CRYS ER 20 MEQ PO TBCR
40.0000 meq | EXTENDED_RELEASE_TABLET | Freq: Four times a day (QID) | ORAL | Status: AC
  Administered 2014-04-03 (×3): 40 meq via ORAL
  Filled 2014-04-03 (×3): qty 2

## 2014-04-03 MED ORDER — SODIUM CHLORIDE 0.9 % IV SOLN: Freq: Once | INTRAVENOUS | Status: DC

## 2014-04-03 MED ORDER — FERROUS SULFATE 325 (65 FE) MG PO TABS: 325.0000 mg | ORAL_TABLET | Freq: Two times a day (BID) | ORAL | Status: AC

## 2014-04-03 MED ORDER — FUROSEMIDE 10 MG/ML IJ SOLN
20.0000 mg | Freq: Once | INTRAMUSCULAR | Status: AC
  Administered 2014-04-03: 20 mg via INTRAVENOUS

## 2014-04-03 NOTE — Discharge Summary (Signed)
Physician Discharge Summary  [name] [name] GEX:[id number] DOB: [birth year] DOA: 03/31/2014  PCP: Arlyss Repress, MD  Admit date: 03/31/2014 Discharge date: 04/03/2014  Time spent: 35 minutes  Recommendations for Outpatient Follow-up:  1. Please follow up on a CBC and BMP, patient was instructed to present to his [facility] office on Monday 04/05/2014 to draw these labs. Has history of anemia and was transfused with 2 units of PRBC's during this hospitalization    Discharge Diagnoses:  Active Problems:   Dyspnea   Symptomatic anemia   Chest pain   Acute on chronic diastolic heart failure   CHF (congestive heart failure)   Discharge Condition: Stable  Diet recommendation: Heart Healthy Diet  Filed Weights   04/01/14 0630 04/02/14 0610 04/03/14 0536  Weight: 90.13 kg (198 lb 11.2 oz) 89.884 kg (198 lb 2.5 oz) 89.8 kg (197 lb 15.6 oz)    History of present illness:  This is a 80 y.o. year old male with significant past medical history of HTN, afib-not on anticoagulation s/p pacemaker, unspecified CHF presenting with dyspnea. Pt states that he has had progressive dyspnea over the past 6 months. Has been evaluated by cardiology over this time frame. There as concern for valvular disease vs. Afib. Pt was not continued on anticoagulation. Pt also had episode of symptomatic anemia that required hosptialization at [facility] hospital. Had + hemoccult per wife. Was transfuse 2 units pRBCs w/ resolution of sxs. Also had normal endoscopy. Colonoscopy was initially planned, but then tabled as bloody stools resolved. Wife states that pt has had DOE and chest pressure over past 2 weeks. Pt states that he feels severely winded if he walks > 50 yards. States CP is predominantly with exertion. [place] in nature. Relieved with rest. Currently taking baby asa. Denies melena.  On presentation to the ER, hemodynamically stable, afebrile. WBC 6, Hgb 7.2, Cr 1.33, BUN 35. CXR w/ cardiomegaly and pulm vasc congestion.  ProBNP 1200. Trop WNL x1. EKG w/ non specific t wave changes in setting of 1st degree AV block.  Hospital Course:  Patient is an 80 year old with past medical history of CHF with his last transthoracic echocardiogram performed on 03/09/2014 which showed an ejection fraction 50-55%, right ventricular cavity size was moderately dilatated, systolic function moderately reduced, atrium was severely dilated, moderate to severe tricuspid regurgitation. He presented to the emergency department on 03/31/2014 with complaints of increasing cough, shortness of breath and extremity edema. He was admitted to medicine service and started on IV diuresis for acute decompensated congestive heart failure. Patient was found to be anemic having a hemoglobin of 7.3 for which she was typed and crossed and transfused with 2 units of packed red blood cells. He was seen and evaluated by cardiology during this hospitalization. It was felt that anemia likely precipitated acute decompensated congestive heart failure as he showed a good response to blood transfusion. With regard to his anemia, wife reported that he had an EGD done earlier this year at an outside hospital that was found to be normal (per wife's report). Colonoscopy was offered at the time, which patient declined and preferred to manage conservatively. During this hospitalization I offered a GI consultation for further workup of his anemia. For the time being patient and his family wish to manage conservatively and not pursue any GI work ups while in the inpatient service. They agreed to a blood transfusion and to recheck CBC this coming Monday at his PCP's office and consider further workup then. Thus prior to  discharge she was transfused another unit of blood as a.m. lab work showed a downward trend in his hemoglobin to 8.1. Did not have bloody stools, bright red blood per rectum or melena. Patient and family members anxious for him to be discharged today. Plan to discharge  her home after receiving one unit of packed red blood cells.   Procedures:  Status post transfusion with 3 units of packed red blood cells  Consultations:  Cardiology  Discharge Exam: Filed Vitals:   04/03/14 1337  BP: 102/56  Pulse: 76  Temp: 97.8 F (36.6 C)  Resp: 20    General: Patient reports feeling a little better today ambulated down the hallway  Cardiovascular: Regular rate and rhythm, normal S1S2  Respiratory: Improvement to bibasilar crackles, normal inspiratory effort on RA Abdomen: Soft nontender nondistended  Musculoskeletal: Has 2+ edema   Discharge Instructions You were cared for by a hospitalist during your hospital stay. If you have any questions about your discharge medications or the care you received while you were in the hospital after you are discharged, you can call the unit and asked to speak with the hospitalist on call if the hospitalist that took care of you is not available. Once you are discharged, your primary care physician will handle any further medical issues. Please note that NO REFILLS for any discharge medications will be authorized once you are discharged, as it is imperative that you return to your primary care physician (or establish a relationship with a primary care physician if you do not have one) for your aftercare needs so that they can reassess your need for medications and monitor your lab values.  Discharge Instructions   (HEART FAILURE PATIENTS) Call MD:  Anytime you have any of the following symptoms: 1) 3 pound weight gain in 24 hours or 5 pounds in 1 week 2) shortness of breath, with or without a dry hacking cough 3) swelling in the hands, feet or stomach 4) if you have to sleep on extra pillows at night in order to breathe.    Complete by:  As directed      Call MD for:  difficulty breathing, headache or visual disturbances    Complete by:  As directed      Call MD for:  extreme fatigue    Complete by:  As directed      Call  MD for:  persistant dizziness or light-headedness    Complete by:  As directed      Call MD for:  persistant nausea and vomiting    Complete by:  As directed      Call MD for:  redness, tenderness, or signs of infection (pain, swelling, redness, odor or green/yellow discharge around incision site)    Complete by:  As directed      Call MD for:  severe uncontrolled pain    Complete by:  As directed      Call MD for:  temperature >100.4    Complete by:  As directed      Diet - low sodium heart healthy    Complete by:  As directed      Discharge instructions    Complete by:  As directed   Please present to your [facility]'s office on Monday to have a CBC (blood count) check     Increase activity slowly    Complete by:  As directed             Medication List  aspirin EC 81 MG tablet  Take 81 mg by mouth daily.     ferrous sulfate 325 (65 FE) MG tablet  Take 1 tablet (325 mg total) by mouth 2 (two) times daily with a meal.     furosemide 40 MG tablet  Commonly known as:  LASIX  Take 1 tablet (40 mg total) by mouth 2 (two) times daily.     metFORMIN 500 MG 24 hr tablet  Commonly known as:  GLUCOPHAGE-XR  Take 500 mg by mouth daily.     metoprolol succinate 50 MG 24 hr tablet  Commonly known as:  TOPROL-XL  Take 50 mg by mouth daily.     multivitamin with minerals Tabs tablet  Take 1 tablet by mouth daily. Centrum Silver     pantoprazole 40 MG tablet  Commonly known as:  PROTONIX  Take 40 mg by mouth daily.     potassium chloride 10 MEQ tablet  Commonly known as:  K-DUR  Take 4 tablets (40 mEq total) by mouth 2 (two) times daily.     tamsulosin 0.4 MG Caps capsule  Commonly known as:  FLOMAX  Take 0.4 mg by mouth at bedtime.     trandolapril 2 MG tablet  Commonly known as:  MAVIK  Take 2 mg by mouth daily.     triamcinolone cream 0.1 %  Commonly known as:  KENALOG  Apply 1 application topically 2 (two) times daily as needed (itching/irritation).        No Known Allergies     Follow-up Information   Follow up with [facility], MD In 1 week.   Specialty:  Internal Medicine       The results of significant diagnostics from this hospitalization (including imaging, microbiology, ancillary and laboratory) are listed below for reference.    Significant Diagnostic Studies: Dg Chest 2 View  03/31/2014   CLINICAL DATA:  Congestive heart failure.  EXAM: CHEST  2 VIEW  COMPARISON:  11/12/2006 chest radiographs from [facility] [place].  FINDINGS: Left subclavian pacemaker lead appears unchanged at the right ventricular apex. The heart is enlarged. There is chronic vascular congestion without overt pulmonary edema. There are new small bilateral pleural effusions associated with linear bibasilar atelectasis. There is no confluent airspace opacity. The bones are demineralized without acute findings. There is a stable chondroid lesion within the left humeral head.  IMPRESSION: Cardiomegaly with vascular congestion, small pleural effusions and mild bibasilar atelectasis. No overt pulmonary edema.   Electronically Signed   By: Camie Patience M.D.   On: 03/31/2014 18:43    Microbiology: No results found for this or any previous visit (from the past 240 hour(s)).   Labs: Basic Metabolic Panel:  Recent Labs Lab 03/31/14 1800 04/02/14 0353 04/03/14 0420  NA 142 143 142  K 3.7 3.8 3.1*  CL 100 100 99  CO2 28 31 32  GLUCOSE 132* 88 94  BUN 35* 29* 29*  CREATININE 1.33 1.17 1.13  CALCIUM 8.8 8.8 8.5   Liver Function Tests:  Recent Labs Lab 04/02/14 0353 04/03/14 0420  AST 27 24  ALT 12 11  ALKPHOS 60 58  BILITOT 1.9* 1.7*  PROT 6.3 5.9*  ALBUMIN 3.7 3.4*   No results found for this basename: LIPASE, AMYLASE,  in the last 168 hours No results found for this basename: AMMONIA,  in the last 168 hours CBC:  Recent Labs Lab 03/31/14 1800 04/01/14 0833 04/02/14 0353 04/03/14 0420  WBC 6.0 5.8 7.2 6.7  NEUTROABS   --   --  5.0 4.8  HGB 7.2* 8.9* 8.4* 8.1*  HCT 24.4* 29.1* 28.2* 27.1*  MCV 72.4* 74.6* 74.8* 75.7*  PLT 158 137* 138* 126*   Cardiac Enzymes:  Recent Labs Lab 03/31/14 1800 03/31/14 2013 04/01/14 0833  TROPONINI <0.30 <0.30 <0.30   BNP: BNP (last 3 results)  Recent Labs  03/31/14 1800  PROBNP 1194.0*   CBG:  Recent Labs Lab 04/02/14 1114 04/02/14 1646 04/02/14 2127 04/03/14 0627 04/03/14 1125  GLUCAP 167* 107* 166* 108* 151*       Signed:  Oluwadamilola Rosamond  [facility] 04/03/2014, 1:46 PM

## 2014-04-03 NOTE — Progress Notes (Signed)
SUBJECTIVE: Pt feeling well, denying chest pain, shortness of breath, abdominal pain, melena. Wants to go home. Wife and son present and wife has several questions with regards to Hgb drop and future plans.     Intake/Output Summary (Last 24 hours) at 04/03/14 1228 Last data filed at 04/03/14 0900  Gross per 24 hour  Intake    780 ml  Output   1000 ml  Net   -220 ml    Current Facility-Administered Medications  Medication Dose Route Frequency Provider Last Rate Last Dose  . 0.9 %  sodium chloride infusion  250 mL Intravenous PRN Shanda Howells, MD      . aspirin EC tablet 81 mg  81 mg Oral Daily Shanda Howells, MD   81 mg at 04/03/14 1028  . furosemide (LASIX) injection 20 mg  20 mg Intravenous Once Shanda Howells, MD      . furosemide (LASIX) tablet 80 mg  80 mg Oral BID Jettie Booze, MD   80 mg at 04/03/14 1028  . insulin aspart (novoLOG) injection 0-5 Units  0-5 Units Subcutaneous QHS Kelvin Cellar, MD      . insulin aspart (novoLOG) injection 0-9 Units  0-9 Units Subcutaneous TID WC Kelvin Cellar, MD   2 Units at 04/02/14 1149  . metoprolol succinate (TOPROL-XL) 24 hr tablet 50 mg  50 mg Oral Daily Shanda Howells, MD   50 mg at 04/02/14 1030  . multivitamin with minerals tablet 1 tablet  1 tablet Oral Daily Shanda Howells, MD   1 tablet at 04/03/14 1028  . pantoprazole (PROTONIX) EC tablet 40 mg  40 mg Oral Daily Shanda Howells, MD   40 mg at 04/03/14 1028  . potassium chloride SA (K-DUR,KLOR-CON) CR tablet 40 mEq  40 mEq Oral Q6H Kelvin Cellar, MD   40 mEq at 04/03/14 1026  . sodium chloride 0.9 % injection 3 mL  3 mL Intravenous Q12H Shanda Howells, MD   3 mL at 04/02/14 2246  . sodium chloride 0.9 % injection 3 mL  3 mL Intravenous Q12H Shanda Howells, MD   3 mL at 04/03/14 1028  . sodium chloride 0.9 % injection 3 mL  3 mL Intravenous PRN Shanda Howells, MD      . tamsulosin [facility]) capsule 0.4 mg  0.4 mg Oral QHS Shanda Howells, MD   0.4 mg at 04/02/14 2247  .  trandolapril (MAVIK) tablet 4 mg  4 mg Oral Daily Shanda Howells, MD   4 mg at 04/03/14 1028  . triamcinolone cream (KENALOG) 0.1 % 1 application  1 application Topical BID BM & HS PRN Shanda Howells, MD        Filed Vitals:   04/02/14 1437 04/02/14 2032 04/03/14 0536 04/03/14 0950  BP: 116/57 129/71 95/53 112/69  Pulse: 74 76 72 81  Temp: 97.9 F (36.6 C) 98.3 F (36.8 C) 98 F (36.7 C) 98.7 F (37.1 C)  TempSrc: Oral Oral Oral Oral  Resp: 20 20 20 20   Height:      Weight:   197 lb 15.6 oz (89.8 kg)   SpO2: 93% 98% 94% 96%    PHYSICAL EXAM General: NAD Neck: No JVD, no thyromegaly.  Lungs: Clear to auscultation bilaterally with normal respiratory effort. CV: Nondisplaced PMI.  Regular rate and rhythm, normal S1/S2, no S3/S4, no murmur.  No pretibial edema.  No carotid bruit.  Normal pedal pulses.  Abdomen: Soft, nontender, no hepatosplenomegaly, no distention.  Neurologic: Alert and  oriented x 3.  Psych: Normal affect. Extremities: No clubbing or cyanosis.     LABS: Basic Metabolic Panel:  Recent Labs  04/02/14 0353 04/03/14 0420  NA 143 142  K 3.8 3.1*  CL 100 99  CO2 31 32  GLUCOSE 88 94  BUN 29* 29*  CREATININE 1.17 1.13  CALCIUM 8.8 8.5   Liver Function Tests:  Recent Labs  04/02/14 0353 04/03/14 0420  AST 27 24  ALT 12 11  ALKPHOS 60 58  BILITOT 1.9* 1.7*  PROT 6.3 5.9*  ALBUMIN 3.7 3.4*   No results found for this basename: LIPASE, AMYLASE,  in the last 72 hours CBC:  Recent Labs  04/02/14 0353 04/03/14 0420  WBC 7.2 6.7  NEUTROABS 5.0 4.8  HGB 8.4* 8.1*  HCT 28.2* 27.1*  MCV 74.8* 75.7*  PLT 138* 126*   Cardiac Enzymes:  Recent Labs  03/31/14 1800 03/31/14 2013 04/01/14 0833  TROPONINI <0.30 <0.30 <0.30   BNP: No components found with this basename: POCBNP,  D-Dimer: No results found for this basename: DDIMER,  in the last 72 hours Hemoglobin A1C:  Recent Labs  03/31/14 2013  HGBA1C 6.9*   Fasting Lipid Panel: No  results found for this basename: CHOL, HDL, LDLCALC, TRIG, CHOLHDL, LDLDIRECT,  in the last 72 hours Thyroid Function Tests: No results found for this basename: TSH, T4TOTAL, FREET3, T3FREE, THYROIDAB,  in the last 72 hours Anemia Panel: No results found for this basename: VITAMINB12, FOLATE, FERRITIN, TIBC, IRON, RETICCTPCT,  in the last 72 hours  RADIOLOGY: Dg Chest 2 View  03/31/2014   CLINICAL DATA:  Congestive heart failure.  EXAM: CHEST  2 VIEW  COMPARISON:  11/12/2006 chest radiographs from [place] [facility] [place].  FINDINGS: Left subclavian pacemaker lead appears unchanged at the right ventricular apex. The heart is enlarged. There is chronic vascular congestion without overt pulmonary edema. There are new small bilateral pleural effusions associated with linear bibasilar atelectasis. There is no confluent airspace opacity. The bones are demineralized without acute findings. There is a stable chondroid lesion within the left humeral head.  IMPRESSION: Cardiomegaly with vascular congestion, small pleural effusions and mild bibasilar atelectasis. No overt pulmonary edema.   Electronically Signed   By: Camie Patience M.D.   On: 03/31/2014 18:43      ASSESSMENT AND PLAN: Acute diastolic heart failure, anemia, moderate to severe tricuspid regurgitation:  1. Acute diastolic heart failure: EF 50-55% in early July, no comment on diastology. Negative 2.1 L in last 48 hours on IV Lasix. Recommend switching to oral. His renal function is mildly decreased, likely due to age. Pt and wife eager to go home. Hgb 8.1 from 8.4 yesterday. This is likely contributing to exacerbation. Recommend transfusing 1 unit PRBC prior to discharge. Should have repeat Hgb on 8/3.  2. Anemia: Appears had normal EGD several months ago. Refused colonoscopy but it appears the physician was hesitant to proceed as well as per wife. We had a long discussion regarding possible options-transfusion, capsule endoscopy,  colonoscopy. At this point, they would prefer to have him receive another unit of blood with repeat Hgb check on 8/3. I suggest they meet with their gastroenterologist as an outpatient and perhaps a hematologist if need be. Again, they are eager to go home.   3. History of atrial fibrillation: He is not a good candidate for anticoagulation given his anemia. Continue metoprolol.  Spoke at length with the patient, wife and son regarding disposition. Plan would be  to transfuse 1 unit PRBC's and check Hgb on 8/3. They are not interested in pursuing an inpatient GI or hematology evaluation.  Can be discharged after transfusion.  Time spent: 40 minutes.  Kate Sable, M.D., F.A.C.C.

## 2014-04-04 LAB — TYPE AND SCREEN
ABO/RH(D): A POS
Antibody Screen: NEGATIVE
UNIT DIVISION: 0
Unit division: 0
Unit division: 0

## 2014-04-04 NOTE — Progress Notes (Signed)
Pt dischrarged home with family discussed HF education completed and discharge instructions given. IV and tele discontinued. Vital signs stable. Taken out via wheelchair.

## 2014-04-04 NOTE — Progress Notes (Signed)
Blood transfusion stopped IV infiltrate. Iv team able to get IV started after attempt from staff nurses. Restarted blood  transfusion. Family at the bedside, policy on blood transfusion explained to both patient and family.

## 2014-04-27 ENCOUNTER — Encounter: Payer: Self-pay | Admitting: *Deleted

## 2014-04-28 ENCOUNTER — Ambulatory Visit (INDEPENDENT_AMBULATORY_CARE_PROVIDER_SITE_OTHER): Payer: Medicare Other | Admitting: Internal Medicine

## 2014-04-28 ENCOUNTER — Encounter: Payer: Self-pay | Admitting: Internal Medicine

## 2014-04-28 VITALS — BP 120/66 | HR 66 | Ht 69.0 in | Wt 180.0 lb

## 2014-04-28 DIAGNOSIS — I480 Paroxysmal atrial fibrillation: Secondary | ICD-10-CM

## 2014-04-28 DIAGNOSIS — I4891 Unspecified atrial fibrillation: Secondary | ICD-10-CM

## 2014-04-28 DIAGNOSIS — Z950 Presence of cardiac pacemaker: Secondary | ICD-10-CM

## 2014-04-28 DIAGNOSIS — I495 Sick sinus syndrome: Secondary | ICD-10-CM

## 2014-04-28 LAB — MDC_IDC_ENUM_SESS_TYPE_INCLINIC
Battery Impedance: 4755 Ohm
Battery Voltage: 2.68 V
Brady Statistic RV Percent Paced: 60 %
Lead Channel Impedance Value: 0 Ohm
Lead Channel Pacing Threshold Amplitude: 0.5 V
Lead Channel Sensing Intrinsic Amplitude: 4 mV
Lead Channel Setting Pacing Amplitude: 2.5 V
Lead Channel Setting Pacing Pulse Width: 0.4 ms
Lead Channel Setting Sensing Sensitivity: 2 mV
MDC IDC MSMT BATTERY REMAINING LONGEVITY: 9 mo
MDC IDC MSMT LEADCHNL RV IMPEDANCE VALUE: 427 Ohm
MDC IDC MSMT LEADCHNL RV PACING THRESHOLD PULSEWIDTH: 0.4 ms
MDC IDC SESS DTM: 20150826154247

## 2014-04-28 MED ORDER — FUROSEMIDE 40 MG PO TABS: 80.0000 mg | ORAL_TABLET | Freq: Every day | ORAL | Status: DC

## 2014-04-28 NOTE — Progress Notes (Signed)
Patient Care Team: Arlyss Repress, MD as PCP - General (Internal Medicine)   HPI  [name] [name] is a 80 y.o. male Seen in followup history of atrial fibrillation-presumed and remotely implanted pacemaker. When we first met in June, he was volume overloaded. He was quite adamant about not wanting therapies as he was "in the end of his life".  He was willing to have an echocardiogram which demonstrated normal left ventricular function and severe left atrial enlargement; right ventricular dysfunction with severe TR  He was hospitalized early august and found to be anemic.  GI evaluation included a remote evaluation; the family declined further evaluation.  Past Medical History  Diagnosis Date  . Atrial fibrillation   . Varicose vein of leg   . Hypertension   . Anemia, unspecified   . Other and unspecified angina pectoris   . Chest pain, unspecified   . Gouty arthropathy, unspecified   . Blood in stool     "went away spontaneously"  . Memory loss   . Mitral valve disorders   . Benign localized hyperplasia of prostate with urinary obstruction and other lower urinary tract symptoms (LUTS)(600.21)   . Urinary obstruction, not elsewhere classified   . Varicose veins of lower extremities with inflammation   . Slowing of urinary stream   . Acute on chronic diastolic heart failure   . Pacemaker   . Basal cell carcinoma     "head, arms, hands, legs"  . Squamous carcinoma     "head, arms, hands, legs"  . High cholesterol   . CHF (congestive heart failure)   . Heart murmur   . Phlebitis     "when he broke his hip"  . Asthma     "none since age 17"  . Type II diabetes mellitus   . History of blood transfusion ~ 11/2013; 03/2014  . Arthritis     "generalized aches and pains"    Past Surgical History  Procedure Laterality Date  . Tonsillectomy    . Hip fracture surgery Right ~ 2005  . Cataract extraction w/ intraocular lens implant Bilateral   . Insert / replace / remove  pacemaker  ~ 2005  . Skin cancer excision      "head, arms, hands, legs"  . Mohs surgery      "?leg"    Current Outpatient Prescriptions  Medication Sig Dispense Refill  . aspirin EC 81 MG tablet Take 81 mg by mouth daily.      . ferrous sulfate 325 (65 FE) MG tablet Take 1 tablet (325 mg total) by mouth 2 (two) times daily with a meal.  60 tablet  3  . furosemide (LASIX) 40 MG tablet Take 1 tablet (40 mg total) by mouth 2 (two) times daily.  60 tablet  1  . metFORMIN (GLUCOPHAGE-XR) 500 MG 24 hr tablet Take 500 mg by mouth daily.       . metoprolol (TOPROL-XL) 50 MG 24 hr tablet Take 50 mg by mouth daily.        . Multiple Vitamin (MULTIVITAMIN WITH MINERALS) TABS tablet Take 1 tablet by mouth daily. Centrum Silver      . pantoprazole (PROTONIX) 40 MG tablet Take 40 mg by mouth daily.      . potassium chloride (K-DUR) 10 MEQ tablet Take 4 tablets (40 mEq total) by mouth 2 (two) times daily.  30 tablet  1  . tamsulosin (FLOMAX) 0.4 MG CAPS capsule Take 0.4 mg by mouth at  bedtime.       . trandolapril (MAVIK) 2 MG tablet Take 2 mg by mouth daily.      Marland Kitchen triamcinolone cream (KENALOG) 0.1 % Apply 1 application topically 2 (two) times daily as needed (itching/irritation).        No current facility-administered medications for this visit.    No Known Allergies  Review of Systems negative except from HPI and PMH  Physical Exam BP 120/66  Pulse 66  Ht 5' 9"  (1.753 m)  Wt 180 lb (81.647 kg)  BMI 26.57 kg/m2 Well developed and well nourished in no acute distress HENT normal E scleral and icterus clear Neck Supple JVP flat; carotids brisk and full Clear to ausculation  IRRegular rate and rhythm, 2/6 Soft with active bowel sounds No clubbing cyanosis 3+ Edema Alert and oriented, grossly normal motor and sensory function Skin Warm and Dry    Assessment and  Plan  HFpEF  Pulmonary hypertension/RV failure  Atrial fibrillation-permanent  GI bleeding-anemia  The patient is  feeling better following his transfusion. He is on iron replacement therapy. He remains reluctant to consider anticoagulation; in light of that we will stop his aspirin.  I did encourage him to have close followup of his anemia. We will see him again in 3 month's time for pacemaker followup.  We'll increase his diuretics from 40 twice a day to 80 once a day as his urinary response is not great on his current dose of Lasix. With his right heart failure he might we'll do better on torsemide  than furosemide

## 2014-04-28 NOTE — Patient Instructions (Addendum)
Your physician has recommended you make the following change in your medication:  1) STOP Aspirin 2) CHANGE Furosemide to 80 mg once a day  Remote monitoring is used to monitor your pacemaker from home. This monitoring reduces the number of office visits required to check your device to one time per year. It allows [place] to keep an eye on the functioning of your device to ensure it is working properly. You are scheduled for a device check from home on 08-02-2014. You may send your transmission at any time that day. If you have a wireless device, the transmission will be sent automatically. After your physician reviews your transmission, you will receive a postcard with your next transmission date.  Your physician recommends that you schedule a follow-up appointment in: 12 months with Dr.Klein

## 2014-04-30 ENCOUNTER — Encounter: Payer: Self-pay | Admitting: Internal Medicine

## 2014-06-28 ENCOUNTER — Other Ambulatory Visit: Payer: Self-pay

## 2014-06-28 MED ORDER — POTASSIUM CHLORIDE ER 20 MEQ PO TBCR: EXTENDED_RELEASE_TABLET | ORAL | Status: DC

## 2014-06-28 MED ORDER — FUROSEMIDE 80 MG PO TABS: 80.0000 mg | ORAL_TABLET | Freq: Every day | ORAL | Status: DC

## 2014-08-02 ENCOUNTER — Telehealth: Payer: Self-pay | Admitting: Internal Medicine

## 2014-08-02 ENCOUNTER — Encounter: Payer: Medicare Other | Admitting: *Deleted

## 2014-08-02 NOTE — Telephone Encounter (Signed)
New message         Pt wife is checking on a remote transmission

## 2014-08-02 NOTE — Telephone Encounter (Signed)
Spoke with pt wife she informed me that she has [employer] and that this is the 1st time she has ever had an issue of the home monitor not sending transmission. I informed her that she needed to call tech services b/c I think the issue has to due with an upgrade with her phone company that may require a new home monitor. She verbalized understanding.

## 2014-08-06 ENCOUNTER — Encounter: Payer: Self-pay | Admitting: Cardiology

## 2014-08-31 ENCOUNTER — Encounter: Payer: Self-pay | Admitting: *Deleted

## 2014-09-06 ENCOUNTER — Telehealth: Payer: Self-pay | Admitting: Internal Medicine

## 2014-09-06 NOTE — Telephone Encounter (Signed)
Spoke w/ pt wife and informed her to send transmission when wirex is received. She verbalized understanding.

## 2014-09-06 NOTE — Telephone Encounter (Signed)
New message     1. Has your device fired? no  2. Is you device beeping? no  3. Are you experiencing draining or swelling at device site? No   4. Are you calling to see if we received your device transmission? Receive a letter saying no transmitting was not complete on time. Wife stating They tried to do transmitting . Was told to wait until the device come in . However, the device has not come in. Will be a couple more week. Wife states she talk with them this am.    5. Have you passed out? No

## 2014-09-09 ENCOUNTER — Ambulatory Visit (INDEPENDENT_AMBULATORY_CARE_PROVIDER_SITE_OTHER): Payer: Medicare Other | Admitting: *Deleted

## 2014-09-09 DIAGNOSIS — I495 Sick sinus syndrome: Secondary | ICD-10-CM

## 2014-09-09 DIAGNOSIS — I480 Paroxysmal atrial fibrillation: Secondary | ICD-10-CM

## 2014-09-09 LAB — MDC_IDC_ENUM_SESS_TYPE_REMOTE
Battery Impedance: 5461 Ohm
Battery Remaining Longevity: 6 mo
Battery Voltage: 2.66 V
Brady Statistic RV Percent Paced: 90 %
Lead Channel Impedance Value: 0 Ohm
Lead Channel Impedance Value: 462 Ohm
Lead Channel Pacing Threshold Amplitude: 0.5 V
Lead Channel Pacing Threshold Pulse Width: 0.4 ms
Lead Channel Setting Pacing Amplitude: 2.5 V
MDC IDC SESS DTM: 20160107182052
MDC IDC SET LEADCHNL RV PACING PULSEWIDTH: 0.4 ms
MDC IDC SET LEADCHNL RV SENSING SENSITIVITY: 2 mV

## 2014-09-09 NOTE — Progress Notes (Signed)
Remote pacemaker transmission.   

## 2014-10-07 ENCOUNTER — Telehealth: Payer: Self-pay | Admitting: Internal Medicine

## 2014-10-07 NOTE — Telephone Encounter (Signed)
Gave pt wife results and informed her of battery check on 10-11-14. She verbalized understanding.

## 2014-10-07 NOTE — Telephone Encounter (Signed)
New Message  Pt wanted to speak w/ rn concerning her remote signal on 1/2. Please call back and discuss.

## 2014-10-11 ENCOUNTER — Telehealth: Payer: Self-pay | Admitting: Cardiology

## 2014-10-11 ENCOUNTER — Ambulatory Visit (INDEPENDENT_AMBULATORY_CARE_PROVIDER_SITE_OTHER): Payer: Medicare Other | Admitting: *Deleted

## 2014-10-11 DIAGNOSIS — Z950 Presence of cardiac pacemaker: Secondary | ICD-10-CM

## 2014-10-11 LAB — MDC_IDC_ENUM_SESS_TYPE_REMOTE
Battery Impedance: 5624 Ohm
Brady Statistic RV Percent Paced: 90 %
Date Time Interrogation Session: 20160208211511
Lead Channel Impedance Value: 0 Ohm
Lead Channel Impedance Value: 436 Ohm
Lead Channel Pacing Threshold Pulse Width: 0.4 ms
Lead Channel Setting Pacing Amplitude: 2.5 V
Lead Channel Setting Sensing Sensitivity: 2 mV
MDC IDC MSMT BATTERY REMAINING LONGEVITY: 6 mo
MDC IDC MSMT BATTERY VOLTAGE: 2.66 V
MDC IDC MSMT LEADCHNL RV PACING THRESHOLD AMPLITUDE: 0.5 V
MDC IDC SET LEADCHNL RV PACING PULSEWIDTH: 0.4 ms

## 2014-10-11 NOTE — Telephone Encounter (Signed)
Spoke with pt and reminded pt of remote transmission that is due today. Pt verbalized understanding.   

## 2014-10-11 NOTE — Progress Notes (Signed)
Remote pacemaker transmission.   

## 2014-11-01 ENCOUNTER — Encounter: Payer: Self-pay | Admitting: Cardiology

## 2014-11-08 ENCOUNTER — Encounter: Payer: Self-pay | Admitting: Internal Medicine

## 2014-11-11 ENCOUNTER — Other Ambulatory Visit: Payer: Self-pay | Admitting: Internal Medicine

## 2014-11-11 ENCOUNTER — Ambulatory Visit (INDEPENDENT_AMBULATORY_CARE_PROVIDER_SITE_OTHER): Payer: Medicare Other | Admitting: *Deleted

## 2014-11-11 ENCOUNTER — Encounter: Payer: Self-pay | Admitting: Internal Medicine

## 2014-11-11 ENCOUNTER — Encounter: Payer: Self-pay | Admitting: *Deleted

## 2014-11-11 DIAGNOSIS — Z950 Presence of cardiac pacemaker: Secondary | ICD-10-CM

## 2014-11-11 NOTE — Progress Notes (Signed)
Remote pacemaker transmission.   

## 2014-11-13 ENCOUNTER — Other Ambulatory Visit: Payer: Self-pay | Admitting: Internal Medicine

## 2014-11-18 ENCOUNTER — Encounter: Payer: Self-pay | Admitting: Internal Medicine

## 2014-12-06 ENCOUNTER — Encounter: Payer: Self-pay | Admitting: Cardiology

## 2014-12-13 ENCOUNTER — Ambulatory Visit (INDEPENDENT_AMBULATORY_CARE_PROVIDER_SITE_OTHER): Payer: Medicare Other | Admitting: *Deleted

## 2014-12-13 DIAGNOSIS — I495 Sick sinus syndrome: Secondary | ICD-10-CM

## 2014-12-13 LAB — MDC_IDC_ENUM_SESS_TYPE_REMOTE
Battery Impedance: 6050 Ohm
Battery Remaining Longevity: 4 mo
Battery Voltage: 2.65 V
Date Time Interrogation Session: 20160411141115
Lead Channel Impedance Value: 0 Ohm
Lead Channel Impedance Value: 457 Ohm
Lead Channel Pacing Threshold Pulse Width: 0.4 ms
Lead Channel Setting Pacing Pulse Width: 0.4 ms
Lead Channel Setting Sensing Sensitivity: 2 mV
MDC IDC MSMT LEADCHNL RV PACING THRESHOLD AMPLITUDE: 0.5 V
MDC IDC SET LEADCHNL RV PACING AMPLITUDE: 2.5 V
MDC IDC STAT BRADY RV PERCENT PACED: 89 %

## 2014-12-13 NOTE — Progress Notes (Signed)
Remote pacemaker transmission.   

## 2014-12-29 ENCOUNTER — Other Ambulatory Visit: Payer: Self-pay | Admitting: Internal Medicine

## 2015-01-13 ENCOUNTER — Encounter: Payer: Self-pay | Admitting: Internal Medicine

## 2015-01-13 ENCOUNTER — Ambulatory Visit: Payer: Medicare Other | Admitting: *Deleted

## 2015-01-13 ENCOUNTER — Telehealth: Payer: Self-pay | Admitting: Cardiology

## 2015-01-13 DIAGNOSIS — I495 Sick sinus syndrome: Secondary | ICD-10-CM

## 2015-01-13 NOTE — Progress Notes (Signed)
Remote pacemaker transmission.   

## 2015-01-13 NOTE — Telephone Encounter (Signed)
Confirmed remote transmission w/ pt wife.   

## 2015-01-14 LAB — CUP PACEART REMOTE DEVICE CHECK
Battery Impedance: 6135 Ohm
Battery Voltage: 2.64 V
Date Time Interrogation Session: 20160512142845
Lead Channel Impedance Value: 0 Ohm
Lead Channel Pacing Threshold Pulse Width: 0.4 ms
Lead Channel Setting Pacing Amplitude: 2.5 V
Lead Channel Setting Pacing Pulse Width: 0.4 ms
MDC IDC MSMT BATTERY REMAINING LONGEVITY: 4 mo
MDC IDC MSMT LEADCHNL RV IMPEDANCE VALUE: 440 Ohm
MDC IDC MSMT LEADCHNL RV PACING THRESHOLD AMPLITUDE: 0.5 V
MDC IDC SET LEADCHNL RV SENSING SENSITIVITY: 2 mV
MDC IDC STAT BRADY RV PERCENT PACED: 88 %

## 2015-01-14 NOTE — Progress Notes (Signed)
Charges deleted b/c pt billed last month. Next billable July/2016.

## 2015-01-24 ENCOUNTER — Encounter: Payer: Self-pay | Admitting: Cardiology

## 2015-02-01 ENCOUNTER — Encounter: Payer: Self-pay | Admitting: Internal Medicine

## 2015-02-01 ENCOUNTER — Telehealth: Payer: Self-pay | Admitting: Internal Medicine

## 2015-02-01 NOTE — Telephone Encounter (Signed)
Rose called me from refill dept explaining that [name] [name] called back and spoke with her stating that patient should be taking 80 mEq, total, of Potassium supplement daily.  She apologizes for the misunderstanding.  I told her it was perfectly ok and that I was glad we have confirmed the correct dosage patient should be taking.  She is agreeable.  She understands husband should be taking 40 mEq in morning and 40 mEq in evening.

## 2015-02-01 NOTE — Telephone Encounter (Signed)
Patient's wife calling because they are confused as to what Potassium dosage he should be taking.  She states it is different from what refill they received.   She kept referring to paperwork that she had, from our office, stating it was 40 mEq daily total - however, I informed her that what I am seeing is different from her statement.  She cannot seem to locate paperwork and wants to look for it and call me back before we make any decisions/changes. Wife will call me back shortly, once paperwork is located.

## 2015-02-01 NOTE — Telephone Encounter (Signed)
New problem   Pt's wife need to know how much Potassium the pt is suppose to be taking. Please call pt's wife

## 2015-02-06 ENCOUNTER — Encounter: Payer: Self-pay | Admitting: Internal Medicine

## 2015-02-14 ENCOUNTER — Ambulatory Visit (INDEPENDENT_AMBULATORY_CARE_PROVIDER_SITE_OTHER): Payer: Medicare Other | Admitting: *Deleted

## 2015-02-14 DIAGNOSIS — Z950 Presence of cardiac pacemaker: Secondary | ICD-10-CM

## 2015-02-14 NOTE — Progress Notes (Signed)
Remote pacemaker transmission.   

## 2015-02-18 LAB — CUP PACEART REMOTE DEVICE CHECK
Battery Impedance: 6625 Ohm
Battery Remaining Longevity: 3 mo
Lead Channel Impedance Value: 0 Ohm
Lead Channel Impedance Value: 461 Ohm
Lead Channel Pacing Threshold Amplitude: 0.5 V
Lead Channel Pacing Threshold Pulse Width: 0.4 ms
Lead Channel Setting Pacing Pulse Width: 0.4 ms
MDC IDC MSMT BATTERY VOLTAGE: 2.61 V
MDC IDC SESS DTM: 20160613133442
MDC IDC SET LEADCHNL RV PACING AMPLITUDE: 2.5 V
MDC IDC SET LEADCHNL RV SENSING SENSITIVITY: 2 mV
MDC IDC STAT BRADY RV PERCENT PACED: 88 %

## 2015-03-11 ENCOUNTER — Encounter: Payer: Self-pay | Admitting: Cardiology

## 2015-03-17 ENCOUNTER — Encounter: Payer: Self-pay | Admitting: Internal Medicine

## 2015-03-21 ENCOUNTER — Ambulatory Visit (INDEPENDENT_AMBULATORY_CARE_PROVIDER_SITE_OTHER): Payer: Medicare Other | Admitting: *Deleted

## 2015-03-21 DIAGNOSIS — Z950 Presence of cardiac pacemaker: Secondary | ICD-10-CM

## 2015-03-21 NOTE — Progress Notes (Signed)
Remote pacemaker transmission.   

## 2015-03-22 LAB — CUP PACEART REMOTE DEVICE CHECK
Battery Impedance: 7010 Ohm
Battery Remaining Longevity: 2 mo
Battery Voltage: 2.62 V
Brady Statistic RV Percent Paced: 88 %
Date Time Interrogation Session: 20160718145249
Lead Channel Impedance Value: 0 Ohm
Lead Channel Impedance Value: 454 Ohm
Lead Channel Setting Pacing Amplitude: 2.5 V
Lead Channel Setting Pacing Pulse Width: 0.4 ms
Lead Channel Setting Sensing Sensitivity: 2 mV

## 2015-03-31 ENCOUNTER — Other Ambulatory Visit: Payer: Self-pay | Admitting: Internal Medicine

## 2015-04-13 ENCOUNTER — Encounter: Payer: Self-pay | Admitting: Cardiology

## 2015-04-18 ENCOUNTER — Encounter: Payer: Self-pay | Admitting: Internal Medicine

## 2015-04-19 ENCOUNTER — Encounter: Payer: Self-pay | Admitting: Internal Medicine

## 2015-04-19 ENCOUNTER — Ambulatory Visit (INDEPENDENT_AMBULATORY_CARE_PROVIDER_SITE_OTHER): Payer: Medicare Other | Admitting: Internal Medicine

## 2015-04-19 VITALS — BP 110/56 | HR 63 | Ht 69.0 in | Wt 171.6 lb

## 2015-04-19 DIAGNOSIS — I495 Sick sinus syndrome: Secondary | ICD-10-CM

## 2015-04-19 DIAGNOSIS — I482 Chronic atrial fibrillation: Secondary | ICD-10-CM

## 2015-04-19 DIAGNOSIS — Z45018 Encounter for adjustment and management of other part of cardiac pacemaker: Secondary | ICD-10-CM | POA: Diagnosis not present

## 2015-04-19 DIAGNOSIS — I4821 Permanent atrial fibrillation: Secondary | ICD-10-CM

## 2015-04-19 MED ORDER — FUROSEMIDE 80 MG PO TABS: 120.0000 mg | ORAL_TABLET | Freq: Every day | ORAL | Status: DC

## 2015-04-19 NOTE — Patient Instructions (Signed)
Medication Instructions:  Your physician has recommended you make the following change in your medication: 1) INCREASE Furosemide to 120 mg daily  Labwork: None ordered  Testing/Procedures: None ordered  Follow-Up: Remote monitoring is used to monitor your Pacemaker  from home. This monitoring reduces the number of office visits required to check your device to one time per year. It allows [place] to keep an eye on the functioning of your device to ensure it is working properly. You are scheduled for a device check from home on 05/23/15. You may send your transmission at any time that day. If you have a wireless device, the transmission will be sent automatically. After your physician reviews your transmission, you will receive a postcard with your next transmission date.  Any Other Special Instructions Will Be Listed Below (If Applicable). Thank you for choosing [facility]!!

## 2015-04-19 NOTE — Progress Notes (Signed)
Patient Care Team: Arlyss Repress, MD as PCP - General (Internal Medicine)   HPI  [name] [name] is a 81 y.o. male Seen in followup history of atrial fibrillation-presumed and remotely implanted pacemaker.    He was willing to have an echocardiogram which demonstrated normal left ventricular function and severe left atrial enlargement; right ventricular dysfunction with severe TR.  The patient denies chest pain, shortness of breath, nocturnal dyspnea, orthopnea There have been no palpitations, lightheadedness or syncope.   Edema is chronic and stable   Past Medical History  Diagnosis Date  . Atrial fibrillation   . Varicose vein of leg   . Hypertension   . Anemia, unspecified   . Other and unspecified angina pectoris   . Chest pain, unspecified   . Gouty arthropathy, unspecified   . Blood in stool     "went away spontaneously"  . Memory loss   . Mitral valve disorders   . Benign localized hyperplasia of prostate with urinary obstruction and other lower urinary tract symptoms (LUTS)(600.21)   . Urinary obstruction, not elsewhere classified   . Varicose veins of lower extremities with inflammation   . Slowing of urinary stream   . Acute on chronic diastolic heart failure   . Pacemaker   . Basal cell carcinoma     "head, arms, hands, legs"  . Squamous carcinoma     "head, arms, hands, legs"  . High cholesterol   . CHF (congestive heart failure)   . Heart murmur   . Phlebitis     "when he broke his hip"  . Asthma     "none since age 79"  . Type II diabetes mellitus   . History of blood transfusion ~ 11/2013; 03/2014  . Arthritis     "generalized aches and pains"    Past Surgical History  Procedure Laterality Date  . Tonsillectomy    . Hip fracture surgery Right ~ 2005  . Cataract extraction w/ intraocular lens implant Bilateral   . Insert / replace / remove pacemaker  ~ 2005  . Skin cancer excision      "head, arms, hands, legs"  . Mohs surgery      "?leg"      Current Outpatient Prescriptions  Medication Sig Dispense Refill  . ferrous sulfate 325 (65 FE) MG tablet Take 1 tablet (325 mg total) by mouth 2 (two) times daily with a meal. 60 tablet 3  . furosemide (LASIX) 80 MG tablet TAKE 1 TABLET (80 MG TOTAL) BY MOUTH DAILY. 90 tablet 3  . metFORMIN (GLUCOPHAGE-XR) 500 MG 24 hr tablet Take 500 mg by mouth daily.     . metoprolol (LOPRESSOR) 50 MG tablet Take 50 mg by mouth daily.    . Multiple Vitamin (MULTIVITAMIN WITH MINERALS) TABS tablet Take 1 tablet by mouth daily. Centrum Silver    . pantoprazole (PROTONIX) 40 MG tablet Take 40 mg by mouth daily.    . Potassium Chloride ER 20 MEQ TBCR TAKE 2 TABLETS BY MOUTH 2 TIMES A DAY FOR A TOTAL OF (80 MEQ) DAILY 120 tablet 6  . tamsulosin (FLOMAX) 0.4 MG CAPS capsule Take 0.4 mg by mouth at bedtime.     . trandolapril (MAVIK) 2 MG tablet Take 2 mg by mouth daily.    Marland Kitchen triamcinolone cream (KENALOG) 0.1 % Apply 1 application topically 2 (two) times daily as needed (itching/irritation).      No current facility-administered medications for this visit.    No  Known Allergies  Review of Systems negative except from HPI and PMH  Physical Exam BP 110/56 mmHg  Pulse 63  Ht 5\' 9"  (1.753 m)  Wt 171 lb 9.6 oz (77.837 kg)  BMI 25.33 kg/m2 Well developed and well nourished in no acute distress HENT normal E scleral and icterus clear Neck Supple JVP flat; carotids brisk and full Clear to ausculation  IRRegular rate and rhythm, 2/6 Soft to apezzx Present  bowel sounds No clubbing cyanosis 3+ Edema Alert and oriented, grossly normal motor and sensory function Skin Warm and Dry   ECG demonstrates atrial fibrillation ventricular pacing intervals-/18/49 axis -75  Assessment and  Plan  HFpEF  Pulmonary hypertension/RV failure  Atrial fibrillation-permanent  GI bleeding-anemia   we'll increase his diuretic from 80/>>120  Daily. He says his metabolic profile was followed by his PCP every 6  months. They were told the labs were all normal.  He is approaching ERI. I suspect the next few months he will require generator replacement. I have reviewed with him the potential implications of reversion to VVI  We have reviewed the benefits and risks of generator replacement.  These include but are not limited to lead fracture and infection.  The patient understands, agrees and is willing to proceed.

## 2015-04-20 LAB — CUP PACEART INCLINIC DEVICE CHECK
Battery Impedance: 7607 Ohm
Battery Voltage: 2.59 V
Lead Channel Impedance Value: 0 Ohm
Lead Channel Impedance Value: 454 Ohm
Lead Channel Pacing Threshold Amplitude: 0.5 V
Lead Channel Pacing Threshold Pulse Width: 0.4 ms
Lead Channel Setting Sensing Sensitivity: 2 mV
MDC IDC MSMT BATTERY REMAINING LONGEVITY: 0 mo
MDC IDC MSMT LEADCHNL RV SENSING INTR AMPL: 5.6 mV
MDC IDC SESS DTM: 20160816173406
MDC IDC SET LEADCHNL RV PACING AMPLITUDE: 2.5 V
MDC IDC SET LEADCHNL RV PACING PULSEWIDTH: 0.4 ms
MDC IDC STAT BRADY RV PERCENT PACED: 88 %

## 2015-04-28 ENCOUNTER — Encounter: Payer: Self-pay | Admitting: Internal Medicine

## 2015-05-23 ENCOUNTER — Ambulatory Visit (INDEPENDENT_AMBULATORY_CARE_PROVIDER_SITE_OTHER): Payer: Medicare Other | Admitting: *Deleted

## 2015-05-23 DIAGNOSIS — Z950 Presence of cardiac pacemaker: Secondary | ICD-10-CM

## 2015-05-23 NOTE — Progress Notes (Signed)
Remote pacemaker transmission.   

## 2015-06-08 ENCOUNTER — Encounter: Payer: Self-pay | Admitting: Internal Medicine

## 2015-06-08 ENCOUNTER — Ambulatory Visit (INDEPENDENT_AMBULATORY_CARE_PROVIDER_SITE_OTHER): Payer: Medicare Other | Admitting: Internal Medicine

## 2015-06-08 VITALS — BP 102/54 | HR 65 | Ht 67.0 in | Wt 168.0 lb

## 2015-06-08 DIAGNOSIS — I4891 Unspecified atrial fibrillation: Secondary | ICD-10-CM

## 2015-06-08 LAB — CUP PACEART INCLINIC DEVICE CHECK
Battery Remaining Longevity: -3 mo
Brady Statistic RV Percent Paced: 94 %
Date Time Interrogation Session: 20161005152821
Lead Channel Impedance Value: 469 Ohm
Lead Channel Pacing Threshold Pulse Width: 0.4 ms
Lead Channel Sensing Intrinsic Amplitude: 5.6 mV
Lead Channel Setting Pacing Amplitude: 2.5 V
MDC IDC MSMT BATTERY IMPEDANCE: 9144 Ohm
MDC IDC MSMT BATTERY VOLTAGE: 2.57 V
MDC IDC MSMT LEADCHNL RA IMPEDANCE VALUE: 0 Ohm
MDC IDC MSMT LEADCHNL RV PACING THRESHOLD AMPLITUDE: 0.5 V
MDC IDC SET LEADCHNL RV PACING PULSEWIDTH: 0.4 ms
MDC IDC SET LEADCHNL RV SENSING SENSITIVITY: 2 mV

## 2015-06-08 NOTE — Patient Instructions (Signed)
Medication Instructions: - no changes  Labwork: - none today  Procedures/Testing: - Your physician has recommended that you have a pacemaker generator (battery) change.Nira Conn will call you to schedule  Follow-Up: - to be determined   Any Additional Special Instructions Will Be Listed Below (If Applicable). - none

## 2015-06-08 NOTE — Progress Notes (Signed)
Patient Care Team: Arlyss Repress, MD as PCP - General (Internal Medicine)   HPI  [name] [name] is a 81 y.o. male Seen in followup history of atrial fibrillation-presumed and remotely implanted pacemaker.  His device has reached ERI.  7/15  echocardiogram >>> normal left ventricular function and severe left atrial enlargement; right ventricular dysfunction with severe TR.  The patient denies chest pain, shortness of breath, nocturnal dyspnea, orthopnea There have been no palpitations, lightheadedness or syncope.   Edema is chronic and stable   Past Medical History  Diagnosis Date  . Atrial fibrillation   . Varicose vein of leg   . Hypertension   . Anemia, unspecified   . Other and unspecified angina pectoris   . Chest pain, unspecified   . Gouty arthropathy, unspecified   . Blood in stool     "went away spontaneously"  . Memory loss   . Mitral valve disorders   . Benign localized hyperplasia of prostate with urinary obstruction and other lower urinary tract symptoms (LUTS)(600.21)   . Urinary obstruction, not elsewhere classified   . Varicose veins of lower extremities with inflammation   . Slowing of urinary stream   . Acute on chronic diastolic heart failure   . Pacemaker   . Basal cell carcinoma     "head, arms, hands, legs"  . Squamous carcinoma     "head, arms, hands, legs"  . High cholesterol   . CHF (congestive heart failure)   . Heart murmur   . Phlebitis     "when he broke his hip"  . Asthma     "none since age 44"  . Type II diabetes mellitus   . History of blood transfusion ~ 11/2013; 03/2014  . Arthritis     "generalized aches and pains"    Past Surgical History  Procedure Laterality Date  . Tonsillectomy    . Hip fracture surgery Right ~ 2005  . Cataract extraction w/ intraocular lens implant Bilateral   . Insert / replace / remove pacemaker  ~ 2005  . Skin cancer excision      "head, arms, hands, legs"  . Mohs surgery      "?leg"     Current Outpatient Prescriptions  Medication Sig Dispense Refill  . ferrous sulfate 325 (65 FE) MG tablet Take 1 tablet (325 mg total) by mouth 2 (two) times daily with a meal. 60 tablet 3  . furosemide (LASIX) 80 MG tablet Take 1.5 tablets (120 mg total) by mouth daily. 135 tablet 3  . metFORMIN (GLUCOPHAGE-XR) 500 MG 24 hr tablet Take 500 mg by mouth daily.     . metoprolol (LOPRESSOR) 50 MG tablet Take 50 mg by mouth daily.    . Multiple Vitamin (MULTIVITAMIN WITH MINERALS) TABS tablet Take 1 tablet by mouth daily. Centrum Silver    . pantoprazole (PROTONIX) 40 MG tablet Take 40 mg by mouth daily.    . Potassium Chloride ER 20 MEQ TBCR TAKE 2 TABLETS BY MOUTH 2 TIMES A DAY FOR A TOTAL OF (80 MEQ) DAILY 120 tablet 6  . tamsulosin (FLOMAX) 0.4 MG CAPS capsule Take 0.4 mg by mouth at bedtime.     . trandolapril (MAVIK) 2 MG tablet Take 2 mg by mouth daily.    Marland Kitchen triamcinolone cream (KENALOG) 0.1 % Apply 1 application topically 2 (two) times daily as needed (itching/irritation).      No current facility-administered medications for this visit.    No Known Allergies  Review of Systems negative except from HPI and PMH  Physical Exam There were no vitals taken for this visit. Well developed and well nourished in no acute distress HENT normal E scleral and icterus clear Neck Supple JVP flat; carotids brisk and full Clear to ausculation  IRRegular rate and rhythm, 2/6 Soft to apezzx Present  bowel sounds No clubbing cyanosis 2+ Edema Alert and oriented, grossly normal motor and sensory function Skin Warm and Dry   ECG demonstrates atrial fibrillation ventricular pacing intervals-/18/49 axis -75  Assessment and  Plan  HFpEF  Pulmonary hypertension/RV failure  Atrial fibrillation-permanent  GI bleeding-anemia  Pacemaker-Medtronic    his device has reached ERI We have reviewed the benefits and risks of generator replacement.  These include but are not limited to lead  fracture and infection.  The patient understands, agrees and is willing to proceed.

## 2015-06-21 ENCOUNTER — Encounter: Payer: Self-pay | Admitting: Internal Medicine

## 2015-06-27 ENCOUNTER — Other Ambulatory Visit: Payer: Self-pay | Admitting: Internal Medicine

## 2015-06-27 ENCOUNTER — Telehealth: Payer: Self-pay | Admitting: Internal Medicine

## 2015-06-27 ENCOUNTER — Encounter: Payer: Self-pay | Admitting: *Deleted

## 2015-06-27 DIAGNOSIS — I509 Heart failure, unspecified: Secondary | ICD-10-CM

## 2015-06-27 NOTE — Telephone Encounter (Signed)
I called the patient to notify him of the date/ time for his PPM generator change. I spoke with his wife and confirmed 07/14/15 at 1:00 pm is ok for his procedure. He will come to the office on 07/07/15 for pre-procedure labs to be done. I will mail a copy of instructions to the patient's home.

## 2015-06-28 NOTE — Telephone Encounter (Signed)
Ok to refill? I don't see any recent labs, but I do see the future order. Please advise. Thanks, MI

## 2015-06-28 NOTE — Telephone Encounter (Signed)
OK to refill  Thanks

## 2015-07-07 ENCOUNTER — Other Ambulatory Visit (INDEPENDENT_AMBULATORY_CARE_PROVIDER_SITE_OTHER): Payer: Medicare Other | Admitting: *Deleted

## 2015-07-07 DIAGNOSIS — I509 Heart failure, unspecified: Secondary | ICD-10-CM | POA: Diagnosis not present

## 2015-07-07 LAB — CBC WITH DIFFERENTIAL/PLATELET
BASOS PCT: 0 % (ref 0–1)
Basophils Absolute: 0 10*3/uL (ref 0.0–0.1)
Eosinophils Absolute: 0.1 10*3/uL (ref 0.0–0.7)
Eosinophils Relative: 1 % (ref 0–5)
HEMATOCRIT: 37.5 % — AB (ref 39.0–52.0)
HEMOGLOBIN: 12.7 g/dL — AB (ref 13.0–17.0)
LYMPHS ABS: 0.6 10*3/uL — AB (ref 0.7–4.0)
LYMPHS PCT: 10 % — AB (ref 12–46)
MCH: 32.4 pg (ref 26.0–34.0)
MCHC: 33.9 g/dL (ref 30.0–36.0)
MCV: 95.7 fL (ref 78.0–100.0)
MONO ABS: 0.5 10*3/uL (ref 0.1–1.0)
MONOS PCT: 9 % (ref 3–12)
MPV: 10.2 fL (ref 8.6–12.4)
NEUTROS PCT: 80 % — AB (ref 43–77)
Neutro Abs: 4.9 10*3/uL (ref 1.7–7.7)
Platelets: 120 10*3/uL — ABNORMAL LOW (ref 150–400)
RBC: 3.92 MIL/uL — ABNORMAL LOW (ref 4.22–5.81)
RDW: 12.8 % (ref 11.5–15.5)
WBC: 6.1 10*3/uL (ref 4.0–10.5)

## 2015-07-07 LAB — BASIC METABOLIC PANEL
BUN: 27 mg/dL — ABNORMAL HIGH (ref 7–25)
CALCIUM: 9.5 mg/dL (ref 8.6–10.3)
CHLORIDE: 99 mmol/L (ref 98–110)
CO2: 31 mmol/L (ref 20–31)
Creat: 1.35 mg/dL — ABNORMAL HIGH (ref 0.70–1.11)
GLUCOSE: 167 mg/dL — AB (ref 65–99)
Potassium: 4.2 mmol/L (ref 3.5–5.3)
SODIUM: 138 mmol/L (ref 135–146)

## 2015-07-07 IMAGING — CR DG CHEST 2V
2 series · 2 of 2 positions shown · non-contrast
Comparison: 11/12/2006 chest radiographs from [REDACTED].

CLINICAL DATA: Congestive heart failure.

EXAM:
CHEST  2 VIEW

[w chest pa]
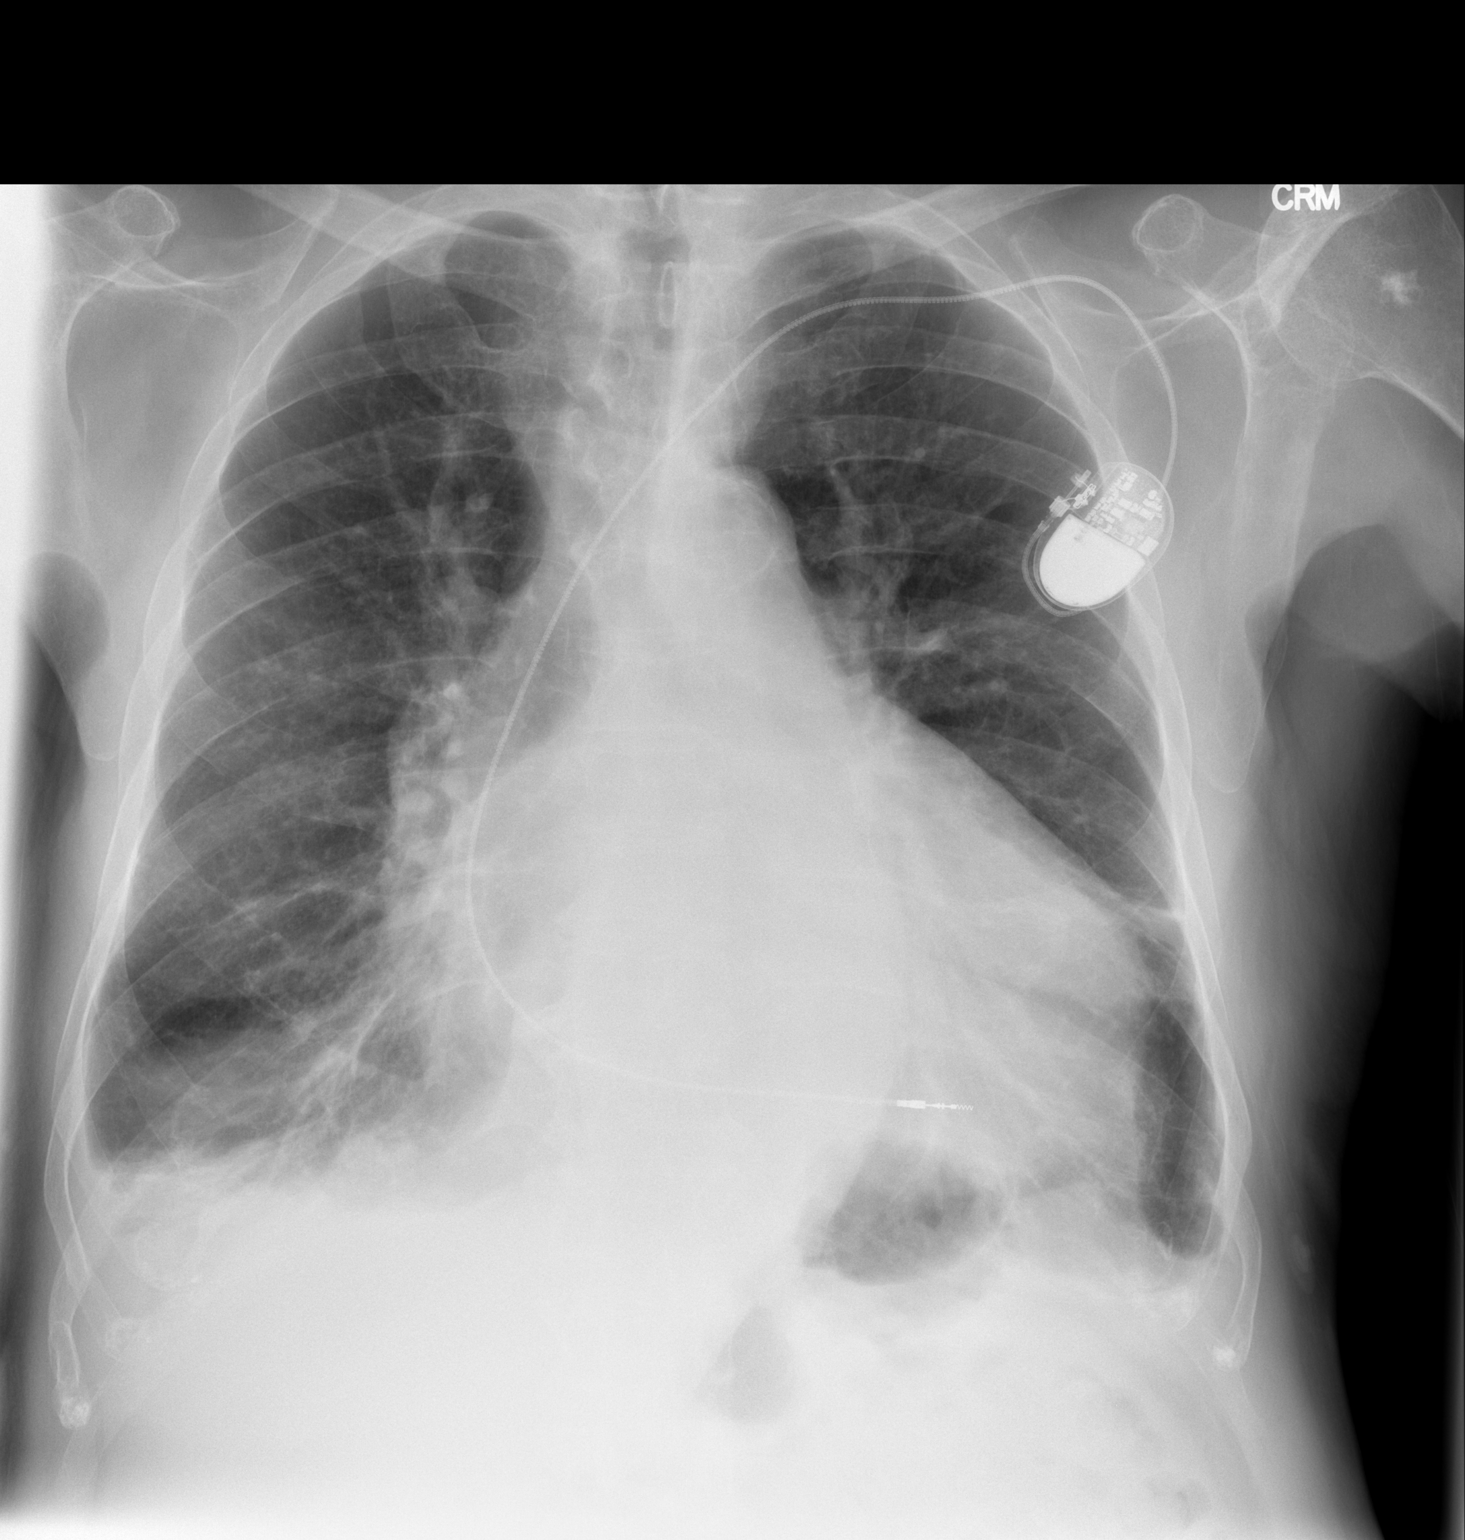

[w chest lat]
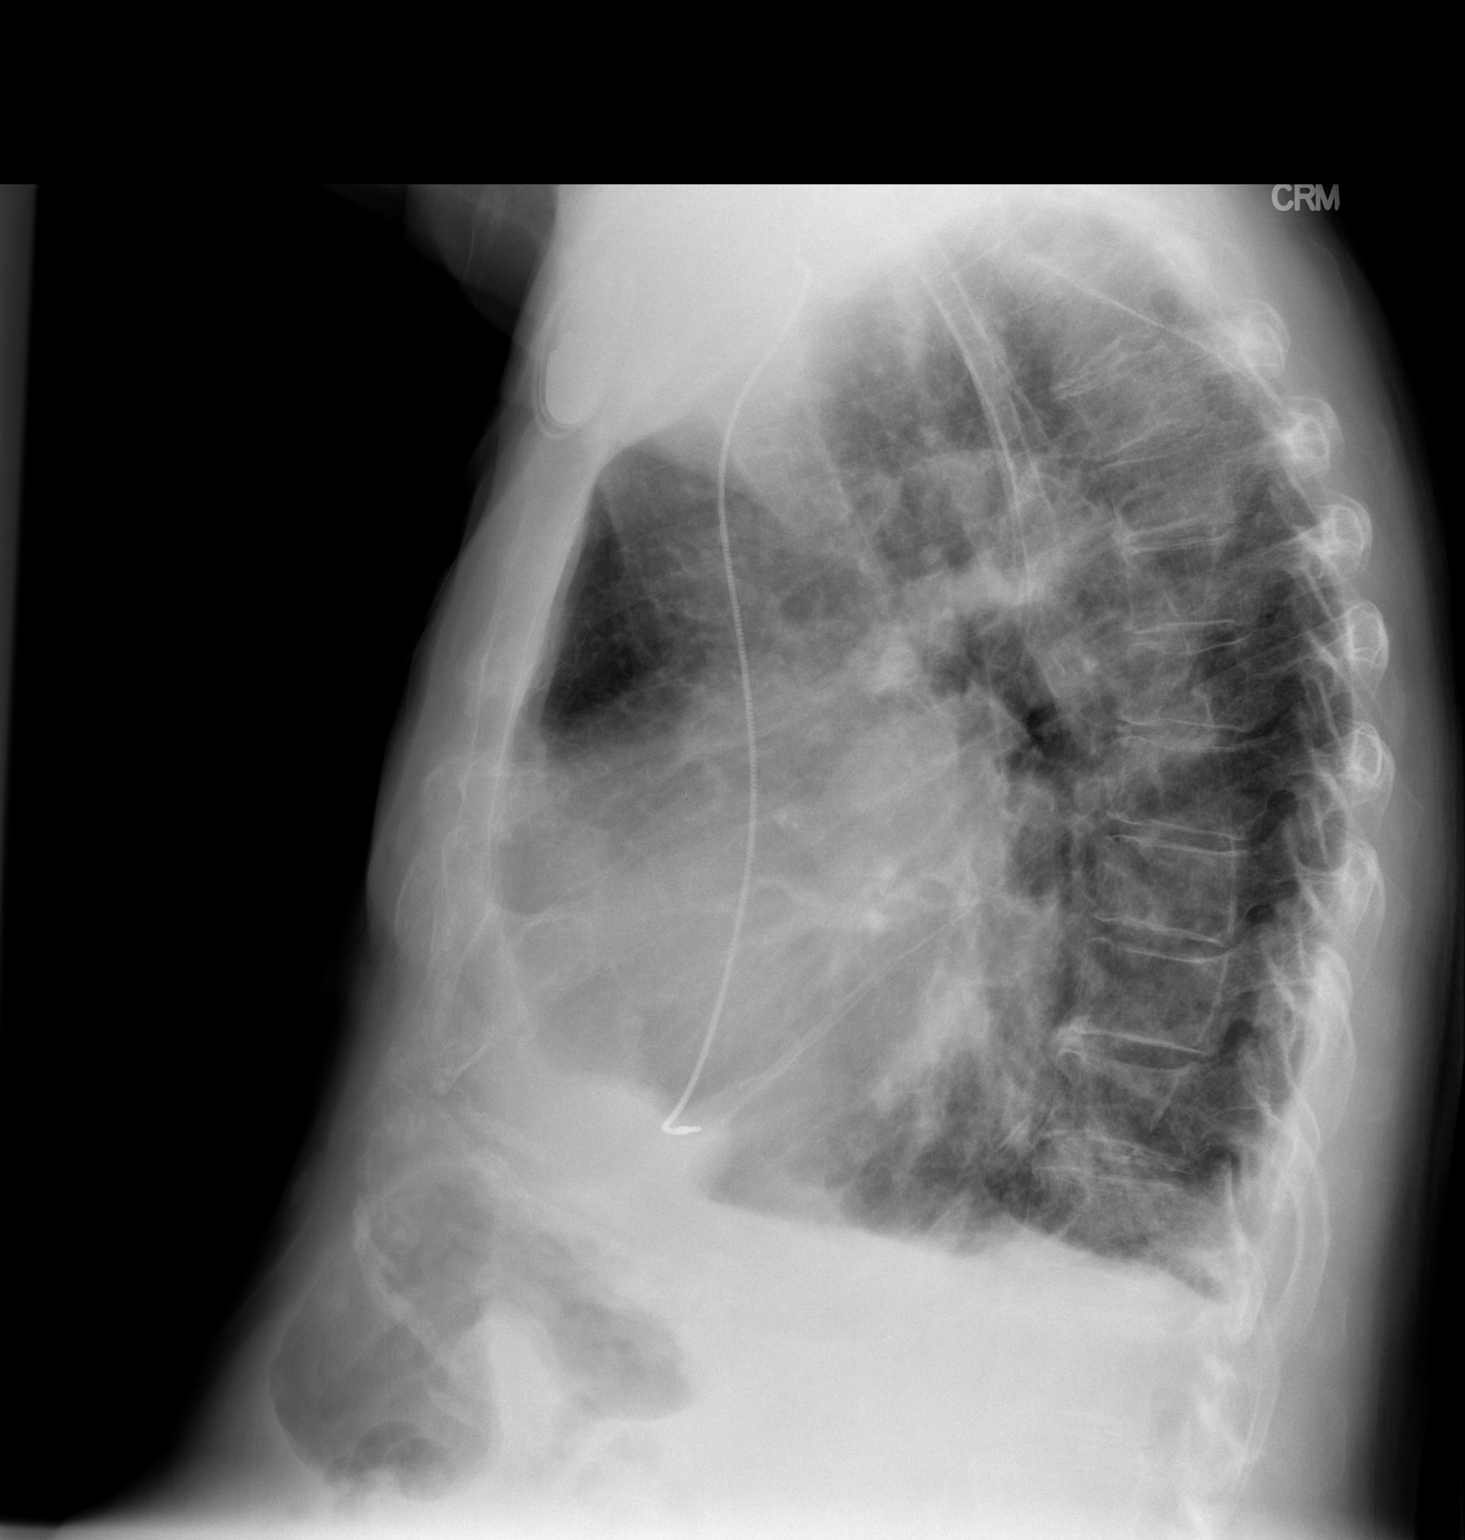

[2 of 2 positions shown; findings below may reference images not displayed]

FINDINGS: Left subclavian pacemaker lead appears unchanged at the right
ventricular apex. The heart is enlarged. There is chronic vascular
congestion without overt pulmonary edema. There are new small
bilateral pleural effusions associated with linear bibasilar
atelectasis. There is no confluent airspace opacity. The bones are
demineralized without acute findings. There is a stable chondroid
lesion within the left humeral head.
IMPRESSION: Cardiomegaly with vascular congestion, small pleural effusions and
mild bibasilar atelectasis. No overt pulmonary edema.

## 2015-07-07 NOTE — Addendum Note (Signed)
Addended by: Eulis Foster on: 07/07/2015 07:55 AM   Modules accepted: Orders

## 2015-07-07 NOTE — Addendum Note (Signed)
Addended by: Eulis Foster on: 07/07/2015 07:56 AM   Modules accepted: Orders

## 2015-07-07 NOTE — Addendum Note (Signed)
Addended by: Eulis Foster on: 07/07/2015 07:57 AM   Modules accepted: Orders

## 2015-07-08 LAB — PROTIME-INR
INR: 1.1 (ref <1.50)
PROTHROMBIN TIME: 14.3 s (ref 11.6–15.2)

## 2015-07-14 ENCOUNTER — Encounter (HOSPITAL_COMMUNITY)
Admission: RE | Disposition: A | Discharge status: Home / Self Care | Payer: Medicare Other | Source: Ambulatory Visit | Attending: Internal Medicine

## 2015-07-14 ENCOUNTER — Ambulatory Visit (HOSPITAL_COMMUNITY)
Admission: RE | Admit: 2015-07-14 | Discharge: 2015-07-14 | Disposition: A | Discharge status: Home / Self Care | Payer: Medicare Other | Source: Ambulatory Visit | Attending: Internal Medicine | Admitting: Internal Medicine

## 2015-07-14 DIAGNOSIS — N401 Enlarged prostate with lower urinary tract symptoms: Secondary | ICD-10-CM | POA: Insufficient documentation

## 2015-07-14 DIAGNOSIS — I272 Other secondary pulmonary hypertension: Secondary | ICD-10-CM | POA: Insufficient documentation

## 2015-07-14 DIAGNOSIS — I482 Chronic atrial fibrillation: Secondary | ICD-10-CM | POA: Diagnosis not present

## 2015-07-14 DIAGNOSIS — I495 Sick sinus syndrome: Secondary | ICD-10-CM

## 2015-07-14 DIAGNOSIS — M1000 Idiopathic gout, unspecified site: Secondary | ICD-10-CM | POA: Insufficient documentation

## 2015-07-14 DIAGNOSIS — Z950 Presence of cardiac pacemaker: Secondary | ICD-10-CM

## 2015-07-14 DIAGNOSIS — Z79899 Other long term (current) drug therapy: Secondary | ICD-10-CM | POA: Insufficient documentation

## 2015-07-14 DIAGNOSIS — I110 Hypertensive heart disease with heart failure: Secondary | ICD-10-CM | POA: Diagnosis not present

## 2015-07-14 DIAGNOSIS — I5033 Acute on chronic diastolic (congestive) heart failure: Secondary | ICD-10-CM | POA: Insufficient documentation

## 2015-07-14 DIAGNOSIS — Z45010 Encounter for checking and testing of cardiac pacemaker pulse generator [battery]: Secondary | ICD-10-CM

## 2015-07-14 DIAGNOSIS — N138 Other obstructive and reflux uropathy: Secondary | ICD-10-CM | POA: Insufficient documentation

## 2015-07-14 DIAGNOSIS — E7800 Pure hypercholesterolemia, unspecified: Secondary | ICD-10-CM | POA: Diagnosis not present

## 2015-07-14 DIAGNOSIS — J45909 Unspecified asthma, uncomplicated: Secondary | ICD-10-CM | POA: Insufficient documentation

## 2015-07-14 DIAGNOSIS — Z7984 Long term (current) use of oral hypoglycemic drugs: Secondary | ICD-10-CM | POA: Insufficient documentation

## 2015-07-14 DIAGNOSIS — E119 Type 2 diabetes mellitus without complications: Secondary | ICD-10-CM | POA: Insufficient documentation

## 2015-07-14 HISTORY — PX: EP IMPLANTABLE DEVICE: SHX172B

## 2015-07-14 LAB — SURGICAL PCR SCREEN
MRSA, PCR: NEGATIVE
STAPHYLOCOCCUS AUREUS: POSITIVE — AB

## 2015-07-14 SURGERY — PPM/BIV PPM GENERATOR CHANGEOUT
Anesthesia: LOCAL

## 2015-07-14 MED ORDER — LIDOCAINE HCL (PF) 1 % IJ SOLN
INTRAMUSCULAR | Status: AC
  Filled 2015-07-14: qty 30

## 2015-07-14 MED ORDER — SODIUM CHLORIDE 0.9 % IR SOLN: 80.0000 mg | Status: AC

## 2015-07-14 MED ORDER — CEFAZOLIN SODIUM-DEXTROSE 2-3 GM-% IV SOLR: 2.0000 g | INTRAVENOUS | Status: AC

## 2015-07-14 MED ORDER — MUPIROCIN 2 % EX OINT: TOPICAL_OINTMENT | Freq: Two times a day (BID) | CUTANEOUS | Status: DC

## 2015-07-14 MED ORDER — GENTAMICIN SULFATE 40 MG/ML IJ SOLN
INTRAMUSCULAR | Status: AC
  Filled 2015-07-14: qty 2

## 2015-07-14 MED ORDER — ONDANSETRON HCL 4 MG/2ML IJ SOLN: 4.0000 mg | Freq: Four times a day (QID) | INTRAMUSCULAR | Status: DC | PRN

## 2015-07-14 MED ORDER — CEFAZOLIN SODIUM-DEXTROSE 2-3 GM-% IV SOLR
INTRAVENOUS | Status: AC
  Filled 2015-07-14: qty 50

## 2015-07-14 MED ORDER — MUPIROCIN 2 % EX OINT
TOPICAL_OINTMENT | CUTANEOUS | Status: AC
  Filled 2015-07-14: qty 22

## 2015-07-14 MED ORDER — SODIUM CHLORIDE 0.9 % IV SOLN
INTRAVENOUS | Status: DC
  Administered 2015-07-14: 12:00:00 via INTRAVENOUS

## 2015-07-14 MED ORDER — HEPARIN (PORCINE) IN NACL 2-0.9 UNIT/ML-% IJ SOLN
INTRAMUSCULAR | Status: AC
  Filled 2015-07-14: qty 1000

## 2015-07-14 MED ORDER — SODIUM CHLORIDE 0.9 % IV SOLN: INTRAVENOUS | Status: AC

## 2015-07-14 MED ORDER — CEFAZOLIN SODIUM-DEXTROSE 2-3 GM-% IV SOLR
INTRAVENOUS | Status: DC | PRN
  Administered 2015-07-14: 2 g via INTRAVENOUS

## 2015-07-14 MED ORDER — ACETAMINOPHEN 325 MG PO TABS
325.0000 mg | ORAL_TABLET | ORAL | Status: DC | PRN
  Filled 2015-07-14: qty 2

## 2015-07-14 SURGICAL SUPPLY — 4 items
CABLE SURGICAL S-101-97-12 (CABLE) ×2 IMPLANT
PAD DEFIB LIFELINK (PAD) ×2 IMPLANT
PPM SENSIA SR SESR01 (Pacemaker) ×2 IMPLANT
TRAY PACEMAKER INSERTION (PACKS) ×2 IMPLANT

## 2015-07-14 NOTE — Discharge Instructions (Signed)
Pacemaker Battery Change °A pacemaker battery usually lasts 4 to 12 years. Once or twice per year, you will be asked to visit your health care provider to have a full evaluation of your pacemaker. When a battery needs to be replaced, the entire pacemaker is replaced so that you can benefit from new circuitry and any new features that have been added to pacemakers. Most often, this procedure is very simple because the leads are already in place.  °There are many things that affect how long a pacemaker battery will last, including:  °· The age of the pacemaker.   °· The number of leads (1, 2, or 3).   °· The pacemaker work load. If the pacemaker is helping the heart more often, the battery will not last as long as it would if the pacemaker did not need to help the heart.   °· Power (voltage) settings.  °LET YOUR HEALTH CARE PROVIDER KNOW ABOUT:  °· Any allergies you have.   °· All medicines you are taking, including vitamins, herbs, eye drops, creams, and over-the-counter medicines.   °· Previous problems you or members of your family have had with the use of anesthetics.   °· Any blood disorders you have.   °· Previous surgeries you have had, especially since your last pacemaker placement.   °· Medical conditions you have.   °· Possibility of pregnancy, if this applies. °· Symptoms of chest pain, trouble breathing, palpitations, light-headedness, or feelings of an abnormal or irregular heartbeat. °RISKS AND COMPLICATIONS  °Generally, this is a safe procedure. However, as with any procedure, problems can occur and include:  °· Bleeding.   °· Bruising of the skin around where the incision was made.   °· Pain at the incision site.   °· Pulling apart of the skin at the incision site.   °· Infection.   °· Allergic reaction to anesthetics or other medicines used during the procedure.   °People with diabetes may have a temporary increase in their blood sugar after any surgical procedure.  °BEFORE THE PROCEDURE  °· Wash all  of the skin around the area of the chest where the pacemaker is located.   °· Ask your health care provider for help with any medicine adjustments before the pacemaker is replaced.   °· Do not eat or drink anything after midnight on the night before the procedure or as directed by your health care provider. °· Ask your health care provider if you can take a sip of water with any approved medicines the morning of the procedure. °PROCEDURE  °· After giving medicine to numb the skin (local anesthetic), your health care provider will make a cut to reopen the pocket holding the pacemaker.   °· The old pacemaker will be disconnected from its leads.   °· The leads will be tested.   °· If needed, the leads will be replaced. If the leads are functioning properly, the new pacemaker may be connected to the existing leads. °· A heart monitor and the pacemaker programmer will be used to make sure that the new pacemaker is working properly. °· The incision site will then be closed. A dressing will be placed over the pacemaker site. The dressing will be removed 24-48 hours afterward. °AFTER THE PROCEDURE  °· You will be taken to a recovery area after the new pacemaker implant is completed. Your vital signs such as blood pressure, heart rate, breathing, and oxygen levels will be monitored. °· Your health care provider will tell you when you will need to next test your pacemaker or when to return to the office for follow-up   for removal of stitches. °  °This information is not intended to replace advice given to you by your health care provider. Make sure you discuss any questions you have with your health care provider. °  °Document Released: 11/28/2006 Document Revised: 09/10/2014 Document Reviewed: 03/04/2013 °Elsevier Interactive Patient Education ©2016 [employer] ° °

## 2015-07-14 NOTE — H&P (Signed)
Patient Care Team: Arlyss Repress, MD as PCP - General (Internal Medicine)   HPI  [name] [name] is a 81 y.o. male Seen in followup history of atrial fibrillation-presumed and remotely implanted pacemaker. His device has reached ERI.  7/15 echocardiogram >>> normal left ventricular function and severe left atrial enlargement; right ventricular dysfunction with severe TR.  The patient denies chest pain, shortness of breath, nocturnal dyspnea, orthopnea There have been no palpitations, lightheadedness or syncope. Edema is chronic and stable   Past Medical History  Diagnosis Date  . Atrial fibrillation   . Varicose vein of leg   . Hypertension   . Anemia, unspecified   . Other and unspecified angina pectoris   . Chest pain, unspecified   . Gouty arthropathy, unspecified   . Blood in stool     "went away spontaneously"  . Memory loss   . Mitral valve disorders   . Benign localized hyperplasia of prostate with urinary obstruction and other lower urinary tract symptoms (LUTS)(600.21)   . Urinary obstruction, not elsewhere classified   . Varicose veins of lower extremities with inflammation   . Slowing of urinary stream   . Acute on chronic diastolic heart failure   . Pacemaker   . Basal cell carcinoma     "head, arms, hands, legs"  . Squamous carcinoma     "head, arms, hands, legs"  . High cholesterol   . CHF (congestive heart failure)   . Heart murmur   . Phlebitis     "when he broke his hip"  . Asthma     "none since age 25"  . Type II diabetes mellitus   . History of blood transfusion ~ 11/2013; 03/2014  . Arthritis     "generalized aches and pains"    Past Surgical History  Procedure Laterality Date  . Tonsillectomy    . Hip fracture surgery Right ~ 2005  . Cataract extraction w/ intraocular lens implant Bilateral   . Insert / replace / remove  pacemaker  ~ 2005  . Skin cancer excision      "head, arms, hands, legs"  . Mohs surgery      "?leg"    Current Outpatient Prescriptions  Medication Sig Dispense Refill  . ferrous sulfate 325 (65 FE) MG tablet Take 1 tablet (325 mg total) by mouth 2 (two) times daily with a meal. 60 tablet 3  . furosemide (LASIX) 80 MG tablet Take 1.5 tablets (120 mg total) by mouth daily. 135 tablet 3  . metFORMIN (GLUCOPHAGE-XR) 500 MG 24 hr tablet Take 500 mg by mouth daily.     . metoprolol (LOPRESSOR) 50 MG tablet Take 50 mg by mouth daily.    . Multiple Vitamin (MULTIVITAMIN WITH MINERALS) TABS tablet Take 1 tablet by mouth daily. Centrum Silver    . pantoprazole (PROTONIX) 40 MG tablet Take 40 mg by mouth daily.    . Potassium Chloride ER 20 MEQ TBCR TAKE 2 TABLETS BY MOUTH 2 TIMES A DAY FOR A TOTAL OF (80 MEQ) DAILY 120 tablet 6  . tamsulosin (FLOMAX) 0.4 MG CAPS capsule Take 0.4 mg by mouth at bedtime.     . trandolapril (MAVIK) 2 MG tablet Take 2 mg by mouth daily.    Marland Kitchen triamcinolone cream (KENALOG) 0.1 % Apply 1 application topically 2 (two) times daily as needed (itching/irritation).      No current facility-administered medications for this visit.    No Known Allergies  Review of Systems negative except from HPI  and PMH  Physical Exam There were no vitals taken for this visit. Well developed and well nourished in no acute distress HENT normal E scleral and icterus clear Neck Supple JVP flat; carotids brisk and full Clear to ausculation IRRegular rate and rhythm, 2/6 Soft to apezzx Present bowel sounds No clubbing cyanosis 2+ Edema Alert and oriented, grossly normal motor and sensory function Skin Warm and Dry  ECG demonstrates atrial fibrillation ventricular pacing intervals-/18/49 axis -75  Assessment and Plan  HFpEF  Pulmonary hypertension/RV failure  Atrial fibrillation-permanent  GI  bleeding-anemia  Pacemaker-Medtronic    his device has reached ERI We have reviewed the benefits and risks of generator replacement. These include but are not limited to lead fracture and infection. The patient understands, agrees and is willing to proceed.        Patient seen and examined.  Had pacemaker checked which showed battery at [facility].  Due to that, will need generator change.  Discussed the risks and benefits of the procedure again with the patient.  Risks include bleeding, infection, damage to leads among others.  Patient has agreed and we will plan on generator change today.

## 2015-07-15 ENCOUNTER — Encounter (HOSPITAL_COMMUNITY): Payer: Self-pay | Admitting: Internal Medicine

## 2015-07-15 LAB — GLUCOSE, CAPILLARY: GLUCOSE-CAPILLARY: 136 mg/dL — AB (ref 65–99)

## 2015-07-15 MED FILL — Heparin Sodium (Porcine) 2 Unit/ML in Sodium Chloride 0.9%: INTRAMUSCULAR | Qty: 1000 | Status: AC

## 2015-07-15 MED FILL — Sodium Chloride Irrigation Soln 0.9%: Qty: 500 | Status: AC

## 2015-07-15 MED FILL — Gentamicin Sulfate Inj 40 MG/ML: INTRAMUSCULAR | Qty: 2 | Status: AC

## 2015-07-15 MED FILL — Lidocaine HCl Local Preservative Free (PF) Inj 1%: INTRAMUSCULAR | Qty: 30 | Status: AC

## 2015-07-27 ENCOUNTER — Ambulatory Visit (INDEPENDENT_AMBULATORY_CARE_PROVIDER_SITE_OTHER): Payer: Medicare Other | Admitting: *Deleted

## 2015-07-27 DIAGNOSIS — I495 Sick sinus syndrome: Secondary | ICD-10-CM

## 2015-07-27 LAB — CUP PACEART INCLINIC DEVICE CHECK
Battery Impedance: 100 Ohm
Battery Remaining Longevity: 85 mo
Battery Voltage: 2.78 V
Implantable Lead Implant Date: 20070301
Implantable Lead Model: 4076
Lead Channel Impedance Value: 0 Ohm
Lead Channel Pacing Threshold Amplitude: 1.875 V
Lead Channel Sensing Intrinsic Amplitude: 4 mV
Lead Channel Setting Pacing Amplitude: 2.5 V
Lead Channel Setting Pacing Pulse Width: 0.4 ms
MDC IDC LEAD LOCATION: 753860
MDC IDC MSMT LEADCHNL RV IMPEDANCE VALUE: 505 Ohm
MDC IDC MSMT LEADCHNL RV PACING THRESHOLD AMPLITUDE: 0.5 V
MDC IDC MSMT LEADCHNL RV PACING THRESHOLD PULSEWIDTH: 0.4 ms
MDC IDC MSMT LEADCHNL RV PACING THRESHOLD PULSEWIDTH: 0.4 ms
MDC IDC SESS DTM: 20161123162936
MDC IDC SET LEADCHNL RV SENSING SENSITIVITY: 2 mV
MDC IDC STAT BRADY RV PERCENT PACED: 91 %

## 2015-07-27 NOTE — Progress Notes (Signed)
Wound check appointment s/p ppm gen change. Dermabond removed. Wound without redness or edema. Incision edges approximated, wound well healed. Normal device function. Threshold, sensing, and impedance consistent with implant measurements. Device programmed at appropriate safety margins. Histogram distribution appropriate for patient and level of activity. No high ventricular rates noted. Patient educated about wound care, arm mobility, lifting restrictions. Patient prefers to f/u via [facility] in 3 months and w/ [facility] in 6 months.

## 2015-09-19 ENCOUNTER — Encounter: Payer: Self-pay | Admitting: Internal Medicine

## 2015-10-26 ENCOUNTER — Telehealth: Payer: Self-pay | Admitting: Cardiology

## 2015-10-26 ENCOUNTER — Ambulatory Visit (INDEPENDENT_AMBULATORY_CARE_PROVIDER_SITE_OTHER): Payer: Medicare Other | Admitting: *Deleted

## 2015-10-26 DIAGNOSIS — I495 Sick sinus syndrome: Secondary | ICD-10-CM | POA: Diagnosis not present

## 2015-10-26 NOTE — Telephone Encounter (Signed)
Confirmed remote transmission w/ pt wife.   

## 2015-10-27 NOTE — Progress Notes (Signed)
Remote pacemaker transmission.   

## 2015-11-22 LAB — CUP PACEART REMOTE DEVICE CHECK
Battery Impedance: 108 Ohm
Brady Statistic RV Percent Paced: 91 %
Implantable Lead Model: 4076
Lead Channel Impedance Value: 479 Ohm
Lead Channel Pacing Threshold Amplitude: 0.5 V
Lead Channel Setting Pacing Amplitude: 2.5 V
Lead Channel Setting Pacing Pulse Width: 0.4 ms
Lead Channel Setting Sensing Sensitivity: 2 mV
MDC IDC LEAD IMPLANT DT: 20070301
MDC IDC LEAD LOCATION: 753860
MDC IDC MSMT BATTERY REMAINING LONGEVITY: 113 mo
MDC IDC MSMT BATTERY VOLTAGE: 2.78 V
MDC IDC MSMT LEADCHNL RA IMPEDANCE VALUE: 0 Ohm
MDC IDC MSMT LEADCHNL RV PACING THRESHOLD PULSEWIDTH: 0.4 ms
MDC IDC SESS DTM: 20170222221713

## 2015-11-23 ENCOUNTER — Encounter: Payer: Self-pay | Admitting: Cardiology

## 2016-01-02 ENCOUNTER — Encounter: Payer: Medicare Other | Admitting: Internal Medicine

## 2016-01-10 ENCOUNTER — Encounter: Payer: Medicare Other | Admitting: Internal Medicine

## 2016-01-23 ENCOUNTER — Other Ambulatory Visit: Payer: Self-pay | Admitting: Internal Medicine

## 2016-03-14 ENCOUNTER — Encounter: Payer: Self-pay | Admitting: Internal Medicine

## 2016-03-14 ENCOUNTER — Ambulatory Visit (INDEPENDENT_AMBULATORY_CARE_PROVIDER_SITE_OTHER): Payer: Medicare Other | Admitting: Internal Medicine

## 2016-03-14 VITALS — BP 100/52 | HR 81 | Ht 69.0 in | Wt 164.8 lb

## 2016-03-14 DIAGNOSIS — I495 Sick sinus syndrome: Secondary | ICD-10-CM

## 2016-03-14 DIAGNOSIS — I5030 Unspecified diastolic (congestive) heart failure: Secondary | ICD-10-CM | POA: Diagnosis not present

## 2016-03-14 DIAGNOSIS — I482 Chronic atrial fibrillation: Secondary | ICD-10-CM | POA: Diagnosis not present

## 2016-03-14 DIAGNOSIS — Z950 Presence of cardiac pacemaker: Secondary | ICD-10-CM | POA: Diagnosis not present

## 2016-03-14 DIAGNOSIS — I4821 Permanent atrial fibrillation: Secondary | ICD-10-CM

## 2016-03-14 LAB — CUP PACEART INCLINIC DEVICE CHECK
Battery Remaining Longevity: 108 mo
Brady Statistic RV Percent Paced: 94 %
Date Time Interrogation Session: 20170712143403
Implantable Lead Location: 753860
Lead Channel Impedance Value: 0 Ohm
Lead Channel Impedance Value: 472 Ohm
Lead Channel Pacing Threshold Amplitude: 0.5 V
Lead Channel Setting Pacing Amplitude: 2.5 V
MDC IDC LEAD IMPLANT DT: 20070301
MDC IDC MSMT BATTERY IMPEDANCE: 132 Ohm
MDC IDC MSMT BATTERY VOLTAGE: 2.77 V
MDC IDC MSMT LEADCHNL RV PACING THRESHOLD PULSEWIDTH: 0.4 ms
MDC IDC MSMT LEADCHNL RV SENSING INTR AMPL: 5.6 mV
MDC IDC SET LEADCHNL RV PACING PULSEWIDTH: 0.4 ms
MDC IDC SET LEADCHNL RV SENSING SENSITIVITY: 2 mV

## 2016-03-14 LAB — CBC WITH DIFFERENTIAL/PLATELET
BASOS ABS: 62 {cells}/uL (ref 0–200)
Basophils Relative: 1 %
Eosinophils Absolute: 62 cells/uL (ref 15–500)
Eosinophils Relative: 1 %
HEMATOCRIT: 34.5 % — AB (ref 38.5–50.0)
HEMOGLOBIN: 11.7 g/dL — AB (ref 13.2–17.1)
LYMPHS ABS: 558 {cells}/uL — AB (ref 850–3900)
Lymphocytes Relative: 9 %
MCH: 33.1 pg — ABNORMAL HIGH (ref 27.0–33.0)
MCHC: 33.9 g/dL (ref 32.0–36.0)
MCV: 97.7 fL (ref 80.0–100.0)
MONO ABS: 558 {cells}/uL (ref 200–950)
MPV: 10.4 fL (ref 7.5–12.5)
Monocytes Relative: 9 %
NEUTROS PCT: 80 %
Neutro Abs: 4960 cells/uL (ref 1500–7800)
Platelets: 143 10*3/uL (ref 140–400)
RBC: 3.53 MIL/uL — ABNORMAL LOW (ref 4.20–5.80)
RDW: 14.2 % (ref 11.0–15.0)
WBC: 6.2 10*3/uL (ref 3.8–10.8)

## 2016-03-14 NOTE — Patient Instructions (Signed)
Medication Instructions: - Your physician recommends that you continue on your current medications as directed. Please refer to the Current Medication list given to you today.  Labwork: - Your physician recommends that you have lab work today: [employer] /CBC/ TSH   Procedures/Testing: - none  Follow-Up: - Remote monitoring is used to monitor your Pacemaker of ICD from home. This monitoring reduces the number of office visits required to check your device to one time per year. It allows [place] to keep an eye on the functioning of your device to ensure it is working properly. You are scheduled for a device check from home on 06/13/16. You may send your transmission at any time that day. If you have a wireless device, the transmission will be sent automatically. After your physician reviews your transmission, you will receive a postcard with your next transmission date.  - Your physician wants you to follow-up in: 1 year with Tommye Standard, PA for Dr. Caryl Comes. You will receive a reminder letter in the mail two months in advance. If you don't receive a letter, please call our office to schedule the follow-up appointment.  Any Additional Special Instructions Will Be Listed Below (If Applicable).     If you need a refill on your cardiac medications before your next appointment, please call your pharmacy.

## 2016-03-14 NOTE — Progress Notes (Signed)
Patient Care Team: Arlyss Repress, MD as PCP - General (Internal Medicine)   HPI  [name] [name] is a 82 y.o. male Seen in followup history of atrial fibrillation-presumed and remotely implanted pacemaker.  His device  reached ERI  and underwent generator replacement 11/16.  He has not been on anticoagulation because of a history of anemia   7/15  echocardiogram >>> normal left ventricular function and severe left atrial enlargement; right ventricular dysfunction with severe TR.  The patient denies chest pain, shortness of breath, nocturnal dyspnea, orthopnea There have been no palpitations, lightheadedness or syncope.   Edema is chronic and stable  8/15 Cr 1.13 K 3.1 11/16 Cr 1.35 K 4.2 1/17    Hgb 12.6  Past Medical History  Diagnosis Date  . Atrial fibrillation ([place])   . Varicose vein of leg   . Hypertension   . Anemia, unspecified   . Other and unspecified angina pectoris   . Chest pain, unspecified   . Gouty arthropathy, unspecified   . Blood in stool     "went away spontaneously"  . Memory loss   . Mitral valve disorders   . Benign localized hyperplasia of prostate with urinary obstruction and other lower urinary tract symptoms (LUTS)(600.21)   . Urinary obstruction, not elsewhere classified   . Varicose veins of lower extremities with inflammation   . Slowing of urinary stream   . Acute on chronic diastolic heart failure ([place])   . Pacemaker   . Basal cell carcinoma     "head, arms, hands, legs"  . Squamous carcinoma (H[facility])     "head, arms, hands, legs"  . High cholesterol   . CHF (congestive heart failure) ([place])   . Heart murmur   . Phlebitis     "when he broke his hip"  . Asthma     "none since age 45"  . Type II diabetes mellitus ([place])   . History of blood transfusion ~ 11/2013; 03/2014  . Arthritis     "generalized aches and pains"    Past Surgical History  Procedure Laterality Date  . Tonsillectomy    . Hip fracture surgery Right ~ 2005  .  Cataract extraction w/ intraocular lens implant Bilateral   . Insert / replace / remove pacemaker  ~ 2005  . Skin cancer excision      "head, arms, hands, legs"  . Mohs surgery      "?leg"  . Ep implantable device N/A 07/14/2015    Procedure: PPM Generator Changeout;  Surgeon: Deboraha Sprang, MD;  Location: [facility];  Service: Cardiovascular;  Laterality: N/A;    Current Outpatient Prescriptions  Medication Sig Dispense Refill  . ferrous sulfate 325 (65 FE) MG tablet Take 1 tablet (325 mg total) by mouth 2 (two) times daily with a meal. 60 tablet 3  . furosemide (LASIX) 80 MG tablet TAKE 1 TABLET (80 MG TOTAL) BY MOUTH DAILY. 90 tablet 0  . metFORMIN (GLUCOPHAGE-XR) 500 MG 24 hr tablet Take 500 mg by mouth daily.     . metoprolol succinate (TOPROL-XL) 50 MG 24 hr tablet Take 50 mg by mouth daily. Take with or immediately following a meal.    . Multiple Vitamin (MULTIVITAMIN WITH MINERALS) TABS tablet Take 1 tablet by mouth daily. Centrum Silver    . pantoprazole (PROTONIX) 40 MG tablet Take 40 mg by mouth daily.    . Potassium Chloride ER 20 MEQ TBCR TAKE 2 TABLETS BY MOUTH  2 TIMES A DAY FOR A TOTAL OF (80 MEQ) DAILY 120 tablet 11  . tamsulosin (FLOMAX) 0.4 MG CAPS capsule Take 0.4 mg by mouth at bedtime.     . trandolapril (MAVIK) 2 MG tablet Take 2 mg by mouth daily.    Marland Kitchen triamcinolone cream (KENALOG) 0.1 % Apply 1 application topically 2 (two) times daily as needed (for itching / irritation).      No current facility-administered medications for this visit.    No Known Allergies  Review of Systems negative except from HPI and PMH  Physical Exam BP 100/52 mmHg  Pulse 81  Ht 5\' 9"  (1.753 m)  Wt 164 lb 12.8 oz (74.753 kg)  BMI 24.33 kg/m2  SpO2 97% Well developed and well nourished in no acute distress HENT normal E scleral and icterus clear Neck Supple JVP flat; carotids brisk and full Clear to ausculation  IRRegular rate and rhythm, 3/6 systolic murmur radiating  to the apex without change with RR interval Present  bowel sounds No clubbing cyanosis 2+ Edema Alert and oriented, grossly normal motor and sensory function Skin Warm and Dry   ECG demonstrates atrial fibrillation ventricular pacing intervals-/18/49 axis -75  Assessment and  Plan  HFpEF  Pulmonary hypertension/RV failure  TR-severe MR-mild  Atrial fibrillation-permanent  GI bleeding-anemia  Pacemaker-[employer]  Edema is chronic but may be worse  Will await renal function/K to make decision regarding   A decision was made in the past to not anticoagulate because of a history of GI bleeding and anemia.  We'll check a CBC/metabolic profile and TSH

## 2016-03-15 LAB — BASIC METABOLIC PANEL
BUN: 22 mg/dL (ref 7–25)
CALCIUM: 9.3 mg/dL (ref 8.6–10.3)
CHLORIDE: 101 mmol/L (ref 98–110)
CO2: 28 mmol/L (ref 20–31)
Creat: 1.13 mg/dL — ABNORMAL HIGH (ref 0.70–1.11)
GLUCOSE: 120 mg/dL — AB (ref 65–99)
Potassium: 4.3 mmol/L (ref 3.5–5.3)
Sodium: 141 mmol/L (ref 135–146)

## 2016-03-15 LAB — TSH: TSH: 2.98 m[IU]/L (ref 0.40–4.50)

## 2016-04-13 NOTE — Addendum Note (Signed)
Addended by: Freada Bergeron on: 04/13/2016 09:29 AM   Modules accepted: Orders

## 2016-04-27 ENCOUNTER — Other Ambulatory Visit: Payer: Self-pay | Admitting: Internal Medicine

## 2016-06-13 ENCOUNTER — Ambulatory Visit (INDEPENDENT_AMBULATORY_CARE_PROVIDER_SITE_OTHER): Payer: Medicare Other | Admitting: *Deleted

## 2016-06-13 ENCOUNTER — Telehealth: Payer: Self-pay | Admitting: Cardiology

## 2016-06-13 DIAGNOSIS — I495 Sick sinus syndrome: Secondary | ICD-10-CM | POA: Diagnosis not present

## 2016-06-13 NOTE — Progress Notes (Signed)
Remote pacemaker transmission.   

## 2016-06-13 NOTE — Telephone Encounter (Signed)
LMOVM reminding pt to send remote transmission.   

## 2016-06-14 ENCOUNTER — Encounter: Payer: Self-pay | Admitting: Cardiology

## 2016-07-13 LAB — CUP PACEART REMOTE DEVICE CHECK
Battery Impedance: 180 Ohm
Brady Statistic RV Percent Paced: 96 %
Implantable Lead Implant Date: 20070301
Lead Channel Impedance Value: 0 Ohm
Lead Channel Impedance Value: 457 Ohm
Lead Channel Pacing Threshold Amplitude: 0.625 V
Lead Channel Setting Pacing Amplitude: 2.5 V
Lead Channel Setting Pacing Pulse Width: 0.4 ms
Lead Channel Setting Sensing Sensitivity: 2 mV
MDC IDC LEAD LOCATION: 753860
MDC IDC MSMT BATTERY REMAINING LONGEVITY: 101 mo
MDC IDC MSMT BATTERY VOLTAGE: 2.77 V
MDC IDC MSMT LEADCHNL RV PACING THRESHOLD PULSEWIDTH: 0.4 ms
MDC IDC PG IMPLANT DT: 20161110
MDC IDC SESS DTM: 20171011174700

## 2016-08-04 ENCOUNTER — Inpatient Hospital Stay (HOSPITAL_BASED_OUTPATIENT_CLINIC_OR_DEPARTMENT_OTHER)
Admission: EM | Admit: 2016-08-04 | Discharge: 2016-08-07 | DRG: 690 | Disposition: A | Discharge status: Skilled Nursing Facility | Payer: Medicare Other | Attending: Internal Medicine | Admitting: Internal Medicine

## 2016-08-04 ENCOUNTER — Encounter (HOSPITAL_BASED_OUTPATIENT_CLINIC_OR_DEPARTMENT_OTHER): Payer: Self-pay | Admitting: *Deleted

## 2016-08-04 DIAGNOSIS — Z7984 Long term (current) use of oral hypoglycemic drugs: Secondary | ICD-10-CM

## 2016-08-04 DIAGNOSIS — E860 Dehydration: Secondary | ICD-10-CM | POA: Diagnosis present

## 2016-08-04 DIAGNOSIS — I482 Chronic atrial fibrillation: Secondary | ICD-10-CM | POA: Diagnosis not present

## 2016-08-04 DIAGNOSIS — E871 Hypo-osmolality and hyponatremia: Secondary | ICD-10-CM | POA: Diagnosis not present

## 2016-08-04 DIAGNOSIS — Z833 Family history of diabetes mellitus: Secondary | ICD-10-CM

## 2016-08-04 DIAGNOSIS — D649 Anemia, unspecified: Secondary | ICD-10-CM | POA: Diagnosis present

## 2016-08-04 DIAGNOSIS — N3001 Acute cystitis with hematuria: Secondary | ICD-10-CM | POA: Diagnosis not present

## 2016-08-04 DIAGNOSIS — Z820 Family history of epilepsy and other diseases of the nervous system: Secondary | ICD-10-CM

## 2016-08-04 DIAGNOSIS — Z79899 Other long term (current) drug therapy: Secondary | ICD-10-CM | POA: Diagnosis not present

## 2016-08-04 DIAGNOSIS — D696 Thrombocytopenia, unspecified: Secondary | ICD-10-CM | POA: Diagnosis present

## 2016-08-04 DIAGNOSIS — M109 Gout, unspecified: Secondary | ICD-10-CM | POA: Diagnosis present

## 2016-08-04 DIAGNOSIS — Y92009 Unspecified place in unspecified non-institutional (private) residence as the place of occurrence of the external cause: Secondary | ICD-10-CM

## 2016-08-04 DIAGNOSIS — Z961 Presence of intraocular lens: Secondary | ICD-10-CM | POA: Diagnosis present

## 2016-08-04 DIAGNOSIS — R197 Diarrhea, unspecified: Secondary | ICD-10-CM | POA: Diagnosis present

## 2016-08-04 DIAGNOSIS — Z809 Family history of malignant neoplasm, unspecified: Secondary | ICD-10-CM

## 2016-08-04 DIAGNOSIS — Z85828 Personal history of other malignant neoplasm of skin: Secondary | ICD-10-CM

## 2016-08-04 DIAGNOSIS — I4891 Unspecified atrial fibrillation: Secondary | ICD-10-CM | POA: Diagnosis present

## 2016-08-04 DIAGNOSIS — N390 Urinary tract infection, site not specified: Secondary | ICD-10-CM | POA: Diagnosis present

## 2016-08-04 DIAGNOSIS — Z9889 Other specified postprocedural states: Secondary | ICD-10-CM | POA: Diagnosis not present

## 2016-08-04 DIAGNOSIS — E119 Type 2 diabetes mellitus without complications: Secondary | ICD-10-CM | POA: Diagnosis present

## 2016-08-04 DIAGNOSIS — I5032 Chronic diastolic (congestive) heart failure: Secondary | ICD-10-CM | POA: Diagnosis present

## 2016-08-04 DIAGNOSIS — I10 Essential (primary) hypertension: Secondary | ICD-10-CM | POA: Diagnosis present

## 2016-08-04 DIAGNOSIS — W19XXXA Unspecified fall, initial encounter: Secondary | ICD-10-CM | POA: Diagnosis present

## 2016-08-04 DIAGNOSIS — I110 Hypertensive heart disease with heart failure: Secondary | ICD-10-CM | POA: Diagnosis present

## 2016-08-04 DIAGNOSIS — Z87891 Personal history of nicotine dependence: Secondary | ICD-10-CM

## 2016-08-04 DIAGNOSIS — Z9842 Cataract extraction status, left eye: Secondary | ICD-10-CM | POA: Diagnosis not present

## 2016-08-04 DIAGNOSIS — Z8672 Personal history of thrombophlebitis: Secondary | ICD-10-CM

## 2016-08-04 DIAGNOSIS — B9620 Unspecified Escherichia coli [E. coli] as the cause of diseases classified elsewhere: Secondary | ICD-10-CM | POA: Diagnosis present

## 2016-08-04 DIAGNOSIS — M6281 Muscle weakness (generalized): Secondary | ICD-10-CM

## 2016-08-04 DIAGNOSIS — Z9841 Cataract extraction status, right eye: Secondary | ICD-10-CM | POA: Diagnosis not present

## 2016-08-04 LAB — COMPREHENSIVE METABOLIC PANEL
ALBUMIN: 3.6 g/dL (ref 3.5–5.0)
ALK PHOS: 68 U/L (ref 38–126)
ALT: 42 U/L (ref 17–63)
ANION GAP: 9 (ref 5–15)
AST: 72 U/L — ABNORMAL HIGH (ref 15–41)
BUN: 38 mg/dL — ABNORMAL HIGH (ref 6–20)
CALCIUM: 8.9 mg/dL (ref 8.9–10.3)
CHLORIDE: 93 mmol/L — AB (ref 101–111)
CO2: 27 mmol/L (ref 22–32)
Creatinine, Ser: 1.19 mg/dL (ref 0.61–1.24)
GFR calc Af Amer: >60 mL/min (ref >60)
GFR calc non Af Amer: 52 mL/min — ABNORMAL LOW (ref >60)
GLUCOSE: 138 mg/dL — AB (ref 65–99)
Potassium: 3.7 mmol/L (ref 3.5–5.1)
SODIUM: 129 mmol/L — AB (ref 135–145)
Total Bilirubin: 1.7 mg/dL — ABNORMAL HIGH (ref 0.3–1.2)
Total Protein: 6.3 g/dL — ABNORMAL LOW (ref 6.5–8.1)

## 2016-08-04 LAB — CBC WITH DIFFERENTIAL/PLATELET
BASOS ABS: 0 10*3/uL (ref 0.0–0.1)
BASOS PCT: 0 %
EOS ABS: 0.1 10*3/uL (ref 0.0–0.7)
Eosinophils Relative: 0 %
HCT: 33.2 % — ABNORMAL LOW (ref 39.0–52.0)
HEMOGLOBIN: 11.4 g/dL — AB (ref 13.0–17.0)
Lymphocytes Relative: 3 %
Lymphs Abs: 0.3 10*3/uL — ABNORMAL LOW (ref 0.7–4.0)
MCH: 33.9 pg (ref 26.0–34.0)
MCHC: 34.3 g/dL (ref 30.0–36.0)
MCV: 98.8 fL (ref 78.0–100.0)
Monocytes Absolute: 1.3 10*3/uL — ABNORMAL HIGH (ref 0.1–1.0)
Monocytes Relative: 12 %
NEUTROS ABS: 9.5 10*3/uL — AB (ref 1.7–7.7)
NEUTROS PCT: 85 %
PLATELETS: 101 10*3/uL — AB (ref 150–400)
RBC: 3.36 MIL/uL — ABNORMAL LOW (ref 4.22–5.81)
RDW: 12.4 % (ref 11.5–15.5)
WBC: 11.2 10*3/uL — ABNORMAL HIGH (ref 4.0–10.5)

## 2016-08-04 LAB — URINE MICROSCOPIC-ADD ON

## 2016-08-04 LAB — CBG MONITORING, ED: Glucose-Capillary: 211 mg/dL — ABNORMAL HIGH (ref 65–99)

## 2016-08-04 LAB — URINALYSIS, ROUTINE W REFLEX MICROSCOPIC
GLUCOSE, UA: NEGATIVE mg/dL
KETONES UR: NEGATIVE mg/dL
NITRITE: NEGATIVE
PH: 5.5 (ref 5.0–8.0)
Protein, ur: 30 mg/dL — AB
SPECIFIC GRAVITY, URINE: 1.018 (ref 1.005–1.030)

## 2016-08-04 LAB — MRSA PCR SCREENING: MRSA by PCR: NEGATIVE

## 2016-08-04 LAB — GLUCOSE, CAPILLARY: GLUCOSE-CAPILLARY: 137 mg/dL — AB (ref 65–99)

## 2016-08-04 MED ORDER — TAMSULOSIN HCL 0.4 MG PO CAPS
0.4000 mg | ORAL_CAPSULE | Freq: Every day | ORAL | Status: DC
  Administered 2016-08-05 – 2016-08-06 (×2): 0.4 mg via ORAL
  Filled 2016-08-04 (×2): qty 1

## 2016-08-04 MED ORDER — ACETAMINOPHEN 650 MG RE SUPP
650.0000 mg | Freq: Four times a day (QID) | RECTAL | Status: DC | PRN
Start: 2016-08-04 — End: 2016-08-07

## 2016-08-04 MED ORDER — SODIUM CHLORIDE 0.9 % IV SOLN
INTRAVENOUS | Status: AC
  Administered 2016-08-05: via INTRAVENOUS

## 2016-08-04 MED ORDER — ACETAMINOPHEN 325 MG PO TABS
650.0000 mg | ORAL_TABLET | Freq: Four times a day (QID) | ORAL | Status: DC | PRN
  Administered 2016-08-06 – 2016-08-07 (×2): 650 mg via ORAL
  Filled 2016-08-04 (×2): qty 2

## 2016-08-04 MED ORDER — FERROUS SULFATE 325 (65 FE) MG PO TABS
325.0000 mg | ORAL_TABLET | Freq: Two times a day (BID) | ORAL | Status: DC
  Administered 2016-08-05 – 2016-08-07 (×5): 325 mg via ORAL
  Filled 2016-08-04 (×5): qty 1

## 2016-08-04 MED ORDER — ADULT MULTIVITAMIN W/MINERALS CH
1.0000 | ORAL_TABLET | Freq: Every day | ORAL | Status: DC
  Administered 2016-08-05 – 2016-08-07 (×3): 1 via ORAL
  Filled 2016-08-04 (×3): qty 1

## 2016-08-04 MED ORDER — SODIUM CHLORIDE 0.9 % IV BOLUS (SEPSIS)
500.0000 mL | Freq: Once | INTRAVENOUS | Status: AC
  Administered 2016-08-04: 500 mL via INTRAVENOUS

## 2016-08-04 MED ORDER — ONDANSETRON HCL 4 MG/2ML IJ SOLN
4.0000 mg | Freq: Four times a day (QID) | INTRAMUSCULAR | Status: DC | PRN
Start: 2016-08-04 — End: 2016-08-07

## 2016-08-04 MED ORDER — HYDROCODONE-ACETAMINOPHEN 5-325 MG PO TABS: 1.0000 | ORAL_TABLET | ORAL | Status: DC | PRN

## 2016-08-04 MED ORDER — SODIUM CHLORIDE 0.9 % IV SOLN: Freq: Once | INTRAVENOUS | Status: DC

## 2016-08-04 MED ORDER — INSULIN ASPART 100 UNIT/ML ~~LOC~~ SOLN
0.0000 [IU] | Freq: Three times a day (TID) | SUBCUTANEOUS | Status: DC
  Administered 2016-08-05 – 2016-08-07 (×5): 1 [IU] via SUBCUTANEOUS

## 2016-08-04 MED ORDER — PANTOPRAZOLE SODIUM 40 MG PO TBEC: 40.0000 mg | DELAYED_RELEASE_TABLET | Freq: Every day | ORAL | Status: DC

## 2016-08-04 MED ORDER — DEXTROSE 5 % IV SOLN
1.0000 g | INTRAVENOUS | Status: DC
  Administered 2016-08-05 (×2): 1 g via INTRAVENOUS
  Filled 2016-08-04 (×4): qty 10

## 2016-08-04 MED ORDER — INSULIN ASPART 100 UNIT/ML ~~LOC~~ SOLN: 0.0000 [IU] | Freq: Every day | SUBCUTANEOUS | Status: DC

## 2016-08-04 MED ORDER — METOPROLOL SUCCINATE ER 50 MG PO TB24
50.0000 mg | ORAL_TABLET | Freq: Every day | ORAL | Status: DC
  Administered 2016-08-05: 50 mg via ORAL
  Filled 2016-08-04: qty 1

## 2016-08-04 MED ORDER — ONDANSETRON HCL 4 MG PO TABS: 4.0000 mg | ORAL_TABLET | Freq: Four times a day (QID) | ORAL | Status: DC | PRN

## 2016-08-04 MED ORDER — LABETALOL HCL 5 MG/ML IV SOLN: 5.0000 mg | INTRAVENOUS | Status: DC | PRN

## 2016-08-04 NOTE — ED Notes (Signed)
ED Provider at bedside. 

## 2016-08-04 NOTE — H&P (Signed)
History and Physical    [name] [name] [id number] DOB: Jun 08, 1924 DOA: 08/04/2016  PCP: Arlyss Repress, MD   Patient coming from: [place]   Chief Complaint: Watery diarrhea, weakness, fatigue  HPI: [name] [name] is a 82 y.o. male with medical history significant for atrial fibrillation, chronic diastolic CHF, iron deficiency anemia, hypertension, and type 2 diabetes mellitus who presented to the [facility] ED from his independent living facility for evaluation of several days watery diarrhea with dysuria and progressive weakness and fatigue. Patient had reportedly been in his usual state of health until the sudden development of watery diarrhea approximately 4 days ago. There have been no recent long distance travel, sick contacts, or antibiotic use. Patient denied any associated abdominal pain, nausea, or vomiting. He reports approximately 10 episodes of watery diarrhea per day and has had a loss of appetite. Over the same interval, he notes dysuria with a increased urinary urgency and frequency. He denies fevers or chills and denies melena or hematochezia. Patient initially was evaluated at an urgent care, noted to be severely dehydrated, and directed to the [place] emergency department.  [facility] ED Course: Upon arrival to the [facility] ED, patient is found to be afebrile, saturating well on room air, and with vital signs stable. Chemistry panels notable for a sodium of 129 and chloride of 93 with a marked elevation and BUN to creatinine ratio. CBC is notable for a stable normocytic anemia with hemoglobin of 11.4, worse chronic thrombocytopenia with platelets 101,000, and a leukocytosis to 11,200. Urinalysis is suggestive of UTI and feature is moderate hemoglobin. Urine was sent for culture and the patient was treated with 500 mL of normal saline in the ED. Given his profound dehydration and ongoing watery diarrhea, he has been transferred to [facility] for admission to  the medical-surgical unit for further evaluation and IV fluid hydration.  Review of Systems:  All other systems reviewed and apart from HPI, are negative.  Past Medical History:  Diagnosis Date  . Acute on chronic diastolic heart failure ([place])   . Anemia, unspecified   . Arthritis    "generalized aches and pains"  . Asthma    "none since age 23"  . Atrial fibrillation ([place])   . Basal cell carcinoma    "head, arms, hands, legs"  . Benign localized hyperplasia of prostate with urinary obstruction and other lower urinary tract symptoms (LUTS)(600.21)   . Blood in stool    "went away spontaneously"  . Chest pain, unspecified   . CHF (congestive heart failure) ([place])   . Gouty arthropathy, unspecified   . Heart murmur   . High cholesterol   . History of blood transfusion ~ 11/2013; 03/2014  . Hypertension   . Memory loss   . Mitral valve disorders(424.0)   . Other and unspecified angina pectoris   . Pacemaker   . Phlebitis    "when he broke his hip"  . Slowing of urinary stream   . Squamous carcinoma    "head, arms, hands, legs"  . Type II diabetes mellitus ([place])   . Urinary obstruction, not elsewhere classified   . Varicose vein of leg   . Varicose veins of lower extremities with inflammation     Past Surgical History:  Procedure Laterality Date  . CATARACT EXTRACTION W/ INTRAOCULAR LENS IMPLANT Bilateral   . EP IMPLANTABLE DEVICE N/A 07/14/2015   Procedure: PPM Generator Changeout;  Surgeon: Deboraha Sprang, MD;  Location: [facility];  Service: Cardiovascular;  Laterality: N/A;  . HIP FRACTURE SURGERY Right ~ 2005  . INSERT / REPLACE / REMOVE PACEMAKER  ~ 2005  . MOHS SURGERY     "?leg"  . SKIN CANCER EXCISION     "head, arms, hands, legs"  . TONSILLECTOMY       reports that he has quit smoking. His smoking use included Cigarettes. He has a 60.00 pack-year smoking history. He has never used smokeless tobacco. He reports that he does not drink alcohol or use  drugs.  No Known Allergies  Family History  Problem Relation Age of Onset  . Other Mother     ARTERITIS  . Alzheimer's disease Father   . Pneumonia Father 95  . Cancer Brother     PANCREATIC  . Heart Problems Brother     AGE 57  . Diabetes Son      Prior to Admission medications   Medication Sig Start Date End Date Taking? Authorizing Provider  ferrous sulfate 325 (65 FE) MG tablet Take 1 tablet (325 mg total) by mouth 2 (two) times daily with a meal. 04/03/14   Kelvin Cellar, MD  furosemide (LASIX) 80 MG tablet TAKE 1 TABLET (80 MG TOTAL) BY MOUTH DAILY. 04/27/16   Deboraha Sprang, MD  metFORMIN (GLUCOPHAGE-XR) 500 MG 24 hr tablet Take 500 mg by mouth daily.     Historical Provider, MD  metoprolol succinate (TOPROL-XL) 50 MG 24 hr tablet Take 50 mg by mouth daily. Take with or immediately following a meal.    Historical Provider, MD  Multiple Vitamin (MULTIVITAMIN WITH MINERALS) TABS tablet Take 1 tablet by mouth daily. Centrum Silver    Historical Provider, MD  pantoprazole (PROTONIX) 40 MG tablet Take 40 mg by mouth daily.    Historical Provider, MD  Potassium Chloride ER 20 MEQ TBCR TAKE 2 TABLETS BY MOUTH 2 TIMES A DAY FOR A TOTAL OF (80 MEQ) DAILY 06/28/15   Deboraha Sprang, MD  tamsulosin (FLOMAX) 0.4 MG CAPS capsule Take 0.4 mg by mouth at bedtime.     Historical Provider, MD  trandolapril (MAVIK) 2 MG tablet Take 2 mg by mouth daily.    Historical Provider, MD  triamcinolone cream (KENALOG) 0.1 % Apply 1 application topically 2 (two) times daily as needed (for itching / irritation).     Historical Provider, MD    Physical Exam: Vitals:   08/04/16 1631 08/04/16 1700 08/04/16 1730 08/04/16 1919  BP: 109/62 104/60 108/65   Pulse: 78 73 72   Resp: 20 21 21    Temp:      SpO2: 91% 95% 96%   Weight:    76 kg (167 lb 8.8 oz)  Height:    5\' 7"  (1.702 m)      Constitutional: NAD, calm, comfortable Eyes: PERTLA, lids and conjunctivae normal ENMT: Mucous membranes are dry.  Posterior pharynx clear of any exudate or lesions.   Neck: normal, supple, no masses, no thyromegaly Respiratory: clear to auscultation bilaterally, no wheezing, no crackles. Normal respiratory effort.   Cardiovascular: Rate ~80 and irregular with grade 3 systolic murmur at LSB. 1+ pretibial edema bilaterally. No significant JVD. Abdomen: No distension, no tenderness, no masses palpated. Bowel sounds normal.  Musculoskeletal: no clubbing / cyanosis. No joint deformity upper and lower extremities. Normal muscle tone.  Skin: no significant rashes, lesions, ulcers. Warm, dry, well-perfused. Poor turgor.  Neurologic: CN 2-12 grossly intact. Sensation intact, DTR normal. Strength 5/5 in all 4 limbs.  Psychiatric: Normal judgment  and insight. Alert and oriented x 3. Normal mood and affect.     Labs on Admission: I have personally reviewed following labs and imaging studies  CBC:  Recent Labs Lab 08/04/16 1355  WBC 11.2*  NEUTROABS 9.5*  HGB 11.4*  HCT 33.2*  MCV 98.8  PLT 99991111*   Basic Metabolic Panel:  Recent Labs Lab 08/04/16 1355  NA 129*  K 3.7  CL 93*  CO2 27  GLUCOSE 138*  BUN 38*  CREATININE 1.19  CALCIUM 8.9   GFR: Estimated Creatinine Clearance: 38.6 mL/min (by C-G formula based on SCr of 1.19 mg/dL). Liver Function Tests:  Recent Labs Lab 08/04/16 1355  AST 72*  ALT 42  ALKPHOS 68  BILITOT 1.7*  PROT 6.3*  ALBUMIN 3.6   No results for input(s): LIPASE, AMYLASE in the last 168 hours. No results for input(s): AMMONIA in the last 168 hours. Coagulation Profile: No results for input(s): INR, PROTIME in the last 168 hours. Cardiac Enzymes: No results for input(s): CKTOTAL, CKMB, CKMBINDEX, TROPONINI in the last 168 hours. BNP (last 3 results) No results for input(s): PROBNP in the last 8760 hours. HbA1C: No results for input(s): HGBA1C in the last 72 hours. CBG:  Recent Labs Lab 08/04/16 1755  GLUCAP 211*   Lipid Profile: No results for input(s):  CHOL, HDL, LDLCALC, TRIG, CHOLHDL, LDLDIRECT in the last 72 hours. Thyroid Function Tests: No results for input(s): TSH, T4TOTAL, FREET4, T3FREE, THYROIDAB in the last 72 hours. Anemia Panel: No results for input(s): VITAMINB12, FOLATE, FERRITIN, TIBC, IRON, RETICCTPCT in the last 72 hours. Urine analysis:    Component Value Date/Time   COLORURINE AMBER (A) 08/04/2016 1408   APPEARANCEUR CLOUDY (A) 08/04/2016 1408   LABSPEC 1.018 08/04/2016 1408   PHURINE 5.5 08/04/2016 1408   GLUCOSEU NEGATIVE 08/04/2016 1408   HGBUR MODERATE (A) 08/04/2016 1408   BILIRUBINUR SMALL (A) 08/04/2016 1408   KETONESUR NEGATIVE 08/04/2016 1408   PROTEINUR 30 (A) 08/04/2016 1408   NITRITE NEGATIVE 08/04/2016 1408   LEUKOCYTESUR LARGE (A) 08/04/2016 1408   Sepsis Labs: @LABRCNTIP (procalcitonin:4,lacticidven:4) )No results found for this or any previous visit (from the past 240 hour(s)).   Radiological Exams on Admission: No results found.  EKG: Not performed will obtain as appropriate.   Assessment/Plan  1. Watery diarrhea with dehydration  - Uncertain etiology; no fevers, recent abx, sick-contacts, abd pain, or vomiting - BUN:Cr ratio >30 and pt appears dehydrated  - Will send sample for GI pathogen panel and C diff testing; maintain enteric precautions while awaiting results  - Continue IVF hydration with NS at 100 cc/hr; hold Lasix and ACE-i - Advance diet as tolerated  - Repeat chem panel in am    2. UTI  - Pt reports dysuria and increased urinary urgency/frequency - UA suggests infection; there is moderate Hgb on UA  - No prior micro data in EMR  - Treat with empiric Rocephin while awaiting culture data   3. Hyponatremia  - Serum sodium 129 on admission in the setting of dehydration  - Anticipate improvement with IVF hydration  - Continue NS infusion and repeat chem panel in am    4. Chronic atrial fibrillation  - Rate is well-controlled on admission  - CHADS-VASc at least 5 (age x2,  DM, HTN, CHF)  - Follows with cardiology; per cards notes, no AC d/t hx of anemia  - Continue Toprol as tolerated    5. Chronic diastolic CHF  - Appears dehydrated on admission  -  TTE (03/09/14) with EF 50-55%, severe LAE, mild MR, mod-severe TR, severely increased PA pressures - Managed with Lasix 80 mg qD, Toprol, and trandolapril at home; Lasix and trandolapril held on admission d/t dehydration, will resume as appropriate  - Follow daily wts and I/Os   6. Normocytic anemia, thrombocytopenia - Hgb is stable on admission at 11.4; anemia has been attributed to iron-deficiency and iron-supplementation will be continued  - Platelets lower than priors at 101,000; infection may be responsible for the drop; there is no sign of bleeding    7. Hypertension - BP at goal currently  - Continue Toprol as tolerated  - Hold trandolapril until dehydration treated and diarrhea resolved   8. Type II DM  - A1c was 6.9% in July 2015 - Managed with metformin only at home; this will be held in the hospital  - Check CBG with meals and qHS  - Start with a low-intensity sliding-scale correctional and adjust prn    DVT prophylaxis: SCDs Code Status: Full  Family Communication: Wife and sons updated at bedside with patient's permission Disposition Plan: Admit to med-surg Consults called: None Admission status: Inpatient    Vianne Bulls, MD [facility] Pager [phone]  If 7PM-7AM, please contact night-coverage [email or web address] Password [email or web address]  08/04/2016, 7:53 PM

## 2016-08-04 NOTE — ED Triage Notes (Signed)
Pt brought in by family c/o diarrhea x 3 days and increased weakness

## 2016-08-04 NOTE — ED Provider Notes (Signed)
I saw and evaluated the patient, reviewed the resident's note and I agree with the findings and plan.   EKG Interpretation None      82 year old male who presents with diarrhea. He has history of atrial fibrillation, pacemaker placement, diabetes, and chronic diastolic heart failure. Presents from nursing facility where and he reports 5 days of nonbloody diarrhea. Having 8-10 episodes of diarrhea per day, with 3 episodes today. Has not had nausea, vomiting, but endorses diminished appetite and decreased by mouth intake. Denies any abdominal distention or abdominal pain. Fevers or chills noted. Had routine blood work that was performed at his nursing facility today where he was noted to have similar-like derangements and he was subsequently sent to the ED for evaluation.  He is nontoxic and in no acute distress, but appears clinically dry and dehydrated. His vital signs are within normal limits. He has a soft and benign abdomen. Blood work here reveals hyponatremia to 129. Baseline creatinine that elevated BUN. Given small bolus of IV fluids. C. difficile sample is pending at this time given that he is a nursing home patient. Given ongoing diarrhea and clinical dehydration with hyponatremia, plan will be to observe patient in the hospital overnight with gentle IV hydration.   Forde Dandy, MD 08/04/16 [phone]

## 2016-08-04 NOTE — ED Provider Notes (Signed)
[facility]PT [facility] Provider Note   CSN: [id number] Arrival date & time: 08/04/16  1241     History   Chief Complaint Chief Complaint  Patient presents with  . Diarrhea    HPI [name] [name] is a 82 y.o. male.  HPI  This is a 82 year old male with a past medical history of A. fib, hypertension, anemia, gout, memory loss, BPH, diastolic CHF, type 2 diabetes presenting to urgent care for diarrhea.  The patient started having diarrhea on Tuesday. He notes that he would have 8-10 watery stools per day. Today, the diarrhea has improved slightly, he is had 2-3 episodes of "sparse" diarrhea. He denies blood in his stools or melena (although he's had this in the past). He denies any associated abdominal pain. He first ate last night (a [place]) and his wife notes that he ate well this morning. The patient denies nausea or vomiting. No recent antibiotic use.   His family is worried as he's gotten significantly weaker. At baseline he could get out of a chair and ambulate without assistance. Now, he needs assistance getting up from a seated position and is using a rolling walker. They note he fell (or rather rolled out) of the bed a few days ago. He was evaluated after this. They denied any LOC. Unsure of any injuries.  Lab work from this morning revealed a leukocytosis up to 11.1 with a absolute neutrophil count of 9324 as well as elevated absolute monocyte count. His sodium was noted to be low at 129, potassium 4.0, chloride low at 94, creatinine 0.93, and BUNs was 37. His bilirubin was elevated at 1.6, alkaline phosphatase 51, AST 66, ALT 34  Of note, on November 13, his sodium was normal at 143, chloride was normal at 97, creatinine was 1.14 with a be within of 27, total bilirubin was 1.6, AST was 23, ALT was 13, alkaline phosphatase was 58  They found low sodium, dehydrated, "a little infection"   Past Medical History:  Diagnosis Date  . Acute on chronic diastolic heart failure ([place])    . Anemia, unspecified   . Arthritis    "generalized aches and pains"  . Asthma    "none since age 45"  . Atrial fibrillation ([place])   . Basal cell carcinoma    "head, arms, hands, legs"  . Benign localized hyperplasia of prostate with urinary obstruction and other lower urinary tract symptoms (LUTS)(600.21)   . Blood in stool    "went away spontaneously"  . Chest pain, unspecified   . CHF (congestive heart failure) ([place])   . Gouty arthropathy, unspecified   . Heart murmur   . High cholesterol   . History of blood transfusion ~ 11/2013; 03/2014  . Hypertension   . Memory loss   . Mitral valve disorders(424.0)   . Other and unspecified angina pectoris   . Pacemaker   . Phlebitis    "when he broke his hip"  . Slowing of urinary stream   . Squamous carcinoma    "head, arms, hands, legs"  . Type II diabetes mellitus ([place])   . Urinary obstruction, not elsewhere classified   . Varicose vein of leg   . Varicose veins of lower extremities with inflammation     Patient Active Problem List   Diagnosis Date Noted  . Hyponatremia 08/04/2016  . Dehydration 08/04/2016  . Tachy-brady syndrome ([place]) 07/14/2015  . Pacemaker 07/14/2015  . CHF (congestive heart failure) ([place]) 04/01/2014  . Dyspnea 03/31/2014  .  Symptomatic anemia 03/31/2014    Past Surgical History:  Procedure Laterality Date  . CATARACT EXTRACTION W/ INTRAOCULAR LENS IMPLANT Bilateral   . EP IMPLANTABLE DEVICE N/A 07/14/2015   Procedure: PPM Generator Changeout;  Surgeon: Deboraha Sprang, MD;  Location: [facility];  Service: Cardiovascular;  Laterality: N/A;  . HIP FRACTURE SURGERY Right ~ 2005  . INSERT / REPLACE / REMOVE PACEMAKER  ~ 2005  . MOHS SURGERY     "?leg"  . SKIN CANCER EXCISION     "head, arms, hands, legs"  . TONSILLECTOMY         Home Medications    Prior to Admission medications   Medication Sig Start Date End Date Taking? Authorizing Provider  ferrous sulfate 325 (65 FE) MG tablet  Take 1 tablet (325 mg total) by mouth 2 (two) times daily with a meal. 04/03/14   Kelvin Cellar, MD  furosemide (LASIX) 80 MG tablet TAKE 1 TABLET (80 MG TOTAL) BY MOUTH DAILY. 04/27/16   Deboraha Sprang, MD  metFORMIN (GLUCOPHAGE-XR) 500 MG 24 hr tablet Take 500 mg by mouth daily.     Historical Provider, MD  metoprolol succinate (TOPROL-XL) 50 MG 24 hr tablet Take 50 mg by mouth daily. Take with or immediately following a meal.    Historical Provider, MD  Multiple Vitamin (MULTIVITAMIN WITH MINERALS) TABS tablet Take 1 tablet by mouth daily. Centrum Silver    Historical Provider, MD  pantoprazole (PROTONIX) 40 MG tablet Take 40 mg by mouth daily.    Historical Provider, MD  Potassium Chloride ER 20 MEQ TBCR TAKE 2 TABLETS BY MOUTH 2 TIMES A DAY FOR A TOTAL OF (80 MEQ) DAILY 06/28/15   Deboraha Sprang, MD  tamsulosin (FLOMAX) 0.4 MG CAPS capsule Take 0.4 mg by mouth at bedtime.     Historical Provider, MD  trandolapril (MAVIK) 2 MG tablet Take 2 mg by mouth daily.    Historical Provider, MD  triamcinolone cream (KENALOG) 0.1 % Apply 1 application topically 2 (two) times daily as needed (for itching / irritation).     Historical Provider, MD    Family History Family History  Problem Relation Age of Onset  . Other Mother     ARTERITIS  . Alzheimer's disease Father   . Pneumonia Father 95  . Cancer Brother     PANCREATIC  . Heart Problems Brother     AGE 79  . Diabetes Son     Social History Social History  Substance Use Topics  . Smoking status: Former Smoker    Packs/day: 3.00    Years: 20.00    Types: Cigarettes  . Smokeless tobacco: Never Used     Comment: "quit smoking in ~ 1970's"  . Alcohol use No     Allergies   Patient has no known allergies.   Review of Systems Review of Systems  Constitutional: Positive for activity change and appetite change. Negative for chills, diaphoresis, fatigue and fever.  HENT: Negative for congestion, dental problem, mouth sores, sore  throat and trouble swallowing.   Eyes: Negative.   Respiratory: Negative for cough, shortness of breath, wheezing and stridor.   Cardiovascular: Negative for chest pain.  Gastrointestinal: Positive for diarrhea. Negative for abdominal pain, blood in stool, constipation, nausea, rectal pain and vomiting.  Endocrine: Negative.   Genitourinary: Negative for decreased urine volume, difficulty urinating, dysuria and frequency.  Musculoskeletal: Positive for gait problem. Negative for arthralgias and joint swelling.  Allergic/Immunologic: Negative.   Neurological:  Positive for weakness. Negative for dizziness, speech difficulty, light-headedness and headaches.  Hematological: Negative.   Psychiatric/Behavioral: Negative for confusion.     Physical Exam Updated Vital Signs BP 109/62   Pulse 78   Temp 97.8 F (36.6 C)   Resp 20   Ht 5\' 7"  (1.702 m)   Wt 74.8 kg   SpO2 91%   BMI 25.84 kg/m   Physical Exam  Constitutional: He appears well-developed. No distress.  Slow to answer questions  HENT:  Head: Normocephalic.  Right Ear: External ear normal.  Left Ear: External ear normal.  Nose: Nose normal.  Mouth/Throat: Oropharynx is clear and moist. No oropharyngeal exudate.  Eyes: Conjunctivae are normal. Right eye exhibits no discharge. Left eye exhibits no discharge. No scleral icterus.  Neck: Normal range of motion. Neck supple.  Cardiovascular: Normal rate, regular rhythm and normal heart sounds.   No murmur heard. Pulmonary/Chest: Effort normal and breath sounds normal. No respiratory distress. He has no wheezes. He has no rales.  Abdominal: Soft. Bowel sounds are normal. He exhibits distension. There is no tenderness. There is no rebound and no guarding.  Mild distension, no fluid wave  Musculoskeletal: He exhibits edema.  3+ pitting edema on the left LE, 2-3+ pitting edema on the right (stable per wife)  Neurological: He is alert. No sensory deficit. He exhibits normal muscle  tone.  4+/5 strength in the upper and lower extremities bilaterally  Skin: Skin is warm. Capillary refill takes 2 to 3 seconds. He is not diaphoretic.  Mildly decreased skin turgor  Psychiatric: He has a normal mood and affect.     ED Treatments / Results  Labs (all labs ordered are listed, but only abnormal results are displayed) Labs Reviewed  CBC WITH DIFFERENTIAL/PLATELET - Abnormal; Notable for the following:       Result Value   WBC 11.2 (*)    RBC 3.36 (*)    Hemoglobin 11.4 (*)    HCT 33.2 (*)    Platelets 101 (*)    Neutro Abs 9.5 (*)    Lymphs Abs 0.3 (*)    Monocytes Absolute 1.3 (*)    All other components within normal limits  COMPREHENSIVE METABOLIC PANEL - Abnormal; Notable for the following:    Sodium 129 (*)    Chloride 93 (*)    Glucose, Bld 138 (*)    BUN 38 (*)    Total Protein 6.3 (*)    AST 72 (*)    Total Bilirubin 1.7 (*)    GFR calc non Af Amer 52 (*)    All other components within normal limits  URINALYSIS, ROUTINE W REFLEX MICROSCOPIC (NOT AT [facility]) - Abnormal; Notable for the following:    Color, Urine AMBER (*)    APPearance CLOUDY (*)    Hgb urine dipstick MODERATE (*)    Bilirubin Urine SMALL (*)    Protein, ur 30 (*)    Leukocytes, UA LARGE (*)    All other components within normal limits  URINE MICROSCOPIC-ADD ON - Abnormal; Notable for the following:    Squamous Epithelial / LPF 0-5 (*)    Bacteria, UA MANY (*)    All other components within normal limits  C DIFFICILE QUICK SCREEN W PCR REFLEX  URINE CULTURE    EKG  EKG Interpretation None       Radiology No results found.  Procedures Procedures (including critical care time)  Medications Ordered in ED Medications  0.9 %  sodium chloride infusion (  Intravenous Rate/Dose Change 08/04/16 1621)  sodium chloride 0.9 % bolus 500 mL (0 mLs Intravenous Stopped 08/04/16 1434)     Initial Impression / Assessment and Plan / ED Course  I have reviewed the triage vital signs  and the nursing notes.  Pertinent labs & imaging results that were available during my care of the patient were reviewed by me and considered in my medical decision making (see chart for details).  Clinical Course     1400: will get repeat labwork here. U/A ordered. Given persistent diarrhea and the fact that he lives in a nursing home, will test for cdiff if he's able to provide a sample. Will give a 500cc NS bolus. Wife would prefer he not be admitted if possible, she notes he can get IVFs at the [facility]. Will re-evaluate after bolus.  1445: UA is cloudy with many bacteria, small bilirubin, moderate hemoglobin, large leukocytes, negative nitrites, 6-30 RBCs, 0-5 epithelials, too numerous to count WBCs. Urine sent for culture.  Sodium still low at 129, potassium 3.7, creatinine stable at 1.19. CBC still pending.  1545: patient requests to go to the bathroom. He states he feels better after the bolus, however his son notes that he stated he didn't feel any better a few minutes prior to me walking in. I had the patient ambulate. He required significant assistance to stand up with the rolling walker and took a while to get his body over his feet. He had a narrow based gait and initially had difficulty with turning but this improved. I spoke with [employer] about what options the family had as they preferred to do this over hospitalization. The nurse, Colletta Maryland, stated that if we provided orders, however there would not be any provider to evaluate the patient until Monday. After discussing with the family, we determined it would be best to have him transferred to [facility] for admission   1630: discussed admission with Dr. Broadus John with [facility] who will accept the patient at [facility].    Final Clinical Impressions(s) / ED Diagnoses   Final diagnoses:  Hyponatremia  Dehydration   This is a 82 y/o male presenting with 5 days of diarrhea found to be hyponatremic, mildly dehydrated, and  weak on exam. [facility] will admit to Med-Surg at [facility]. To be transported by [facility]   No medications on file     Archie Patten, MD 08/04/16 [place] Liu, MD 08/04/16 2223

## 2016-08-04 NOTE — ED Notes (Signed)
Patient ambulated with minor assist, using rolling walked, to restroom. Patient initially unsteady when turning the corner of the room, more steady on return to room. Observed by MD. Patient family at bedside reports patient is ambulating better currently than he was earlier in the week.

## 2016-08-04 NOTE — ED Notes (Signed)
Report given to [facility], ETA 10-15 minutes

## 2016-08-04 NOTE — Progress Notes (Signed)
Pharmacy Antibiotic Note  [name] [name] is a 82 y.o. male admitted on 08/04/2016 with UTI.  Pharmacy has been consulted for ceftriaxone dosing. Afebrile, WBC 11.2.  Plan: Ceftriaxone 1g IV q24h Monitor clinical progress, c/s, LOT Not renally adjusted - Rx will s/o consult   Height: 5\' 7"  (170.2 cm) Weight: 167 lb 8.8 oz (76 kg) IBW/kg (Calculated) : 66.1  Temp (24hrs), Avg:97.8 F (36.6 C), Min:97.8 F (36.6 C), Max:97.8 F (36.6 C)   Recent Labs Lab 08/04/16 1355  WBC 11.2*  CREATININE 1.19    Estimated Creatinine Clearance: 38.6 mL/min (by C-G formula based on SCr of 1.19 mg/dL).    No Known Allergies   Elicia Lamp, PharmD, BCPS Clinical Pharmacist 08/04/2016 7:55 PM

## 2016-08-04 NOTE — Progress Notes (Signed)
Patient trasfered from [facility] to [phone] via [place]; alert and oriented x 4; no complaints of pain; IV saline locked in RFA. Orient patient to room and unit;  gave patient care guide; instructed how to use the call bell and  fall risk precautions. Will continue to monitor the patient.

## 2016-08-04 NOTE — Progress Notes (Signed)
When completing admission wife stated that she had a yellow DNR form that she would bring with her in the morning.

## 2016-08-04 NOTE — ED Notes (Signed)
Attempted to call report, nurse unavailable. Requested call back in 15 minutes

## 2016-08-04 NOTE — ED Notes (Signed)
[facility] arrived for transport 

## 2016-08-04 NOTE — Progress Notes (Signed)
Called by Dr.Dorsey at [facility] ER 89/F with Memory loss, DM, Afib Presented to ER today with diarrhea for 5days with progressive weakness, unsteady on feet 8-10days, lives at [facility] living facility Labs -Na 129, WBC 11.2, UA abnormal, vitals stable Accepted to Med surg for dehydration, weakness, hyponatremia and diarrhea workup

## 2016-08-04 NOTE — ED Notes (Signed)
Pt and family state pt has had diarrhea since Tues. Increased weakness and confusion. Edema to LLE. Bruising noted to left side of tongue ? From fall. Dr. Lorenso Courier in with pt. Neuro exam WNL. Pt is resident at [facility] and has had labs done last night and this a.m. Pt is alert at present. Follows commands.

## 2016-08-05 LAB — C DIFFICILE QUICK SCREEN W PCR REFLEX
C DIFFICLE (CDIFF) ANTIGEN: NEGATIVE
C Diff interpretation: NOT DETECTED
C Diff toxin: NEGATIVE

## 2016-08-05 LAB — COMPREHENSIVE METABOLIC PANEL
ALBUMIN: 2.7 g/dL — AB (ref 3.5–5.0)
ALT: 34 U/L (ref 17–63)
AST: 49 U/L — AB (ref 15–41)
Alkaline Phosphatase: 59 U/L (ref 38–126)
Anion gap: 8 (ref 5–15)
BILIRUBIN TOTAL: 1.2 mg/dL (ref 0.3–1.2)
BUN: 41 mg/dL — AB (ref 6–20)
CHLORIDE: 97 mmol/L — AB (ref 101–111)
CO2: 27 mmol/L (ref 22–32)
CREATININE: 1.15 mg/dL (ref 0.61–1.24)
Calcium: 8.5 mg/dL — ABNORMAL LOW (ref 8.9–10.3)
GFR calc Af Amer: >60 mL/min (ref >60)
GFR, EST NON AFRICAN AMERICAN: 54 mL/min — AB (ref >60)
GLUCOSE: 113 mg/dL — AB (ref 65–99)
Potassium: 3.7 mmol/L (ref 3.5–5.1)
Sodium: 132 mmol/L — ABNORMAL LOW (ref 135–145)
TOTAL PROTEIN: 5.1 g/dL — AB (ref 6.5–8.1)

## 2016-08-05 LAB — CBC
HEMATOCRIT: 32.1 % — AB (ref 39.0–52.0)
HEMOGLOBIN: 11 g/dL — AB (ref 13.0–17.0)
MCH: 33.4 pg (ref 26.0–34.0)
MCHC: 34.3 g/dL (ref 30.0–36.0)
MCV: 97.6 fL (ref 78.0–100.0)
Platelets: 96 10*3/uL — ABNORMAL LOW (ref 150–400)
RBC: 3.29 MIL/uL — ABNORMAL LOW (ref 4.22–5.81)
RDW: 13.2 % (ref 11.5–15.5)
WBC: 8.9 10*3/uL (ref 4.0–10.5)

## 2016-08-05 LAB — GLUCOSE, CAPILLARY
GLUCOSE-CAPILLARY: 148 mg/dL — AB (ref 65–99)
GLUCOSE-CAPILLARY: 139 mg/dL — AB (ref 65–99)
Glucose-Capillary: 114 mg/dL — ABNORMAL HIGH (ref 65–99)
Glucose-Capillary: 140 mg/dL — ABNORMAL HIGH (ref 65–99)

## 2016-08-05 MED ORDER — PHENAZOPYRIDINE HCL 100 MG PO TABS
200.0000 mg | ORAL_TABLET | Freq: Three times a day (TID) | ORAL | Status: DC
  Administered 2016-08-05 (×3): 200 mg via ORAL
  Filled 2016-08-05 (×3): qty 2

## 2016-08-05 MED ORDER — METOPROLOL SUCCINATE ER 50 MG PO TB24: 50.0000 mg | ORAL_TABLET | Freq: Every day | ORAL | Status: DC

## 2016-08-05 MED ORDER — SODIUM CHLORIDE 0.9 % IV SOLN
INTRAVENOUS | Status: AC
  Administered 2016-08-05: 1000 mL via INTRAVENOUS

## 2016-08-05 NOTE — Progress Notes (Signed)
Entered information on incorrect PIV

## 2016-08-05 NOTE — Progress Notes (Signed)
[facility] PROGRESS NOTE  [name] [name] [id number] DOB: Jun 29, 1924 DOA: 08/04/2016  PCP: Arlyss Repress, MD  Brief History/Interval Summary: A 82 year old Caucasian male with a past medical history of atrial fibrillation, not on anticoagulation, chronic diastolic CHF, iron deficiency anemia, hypertension and type 2 diabetes who presented to the emergency department with complaints of diarrhea, weakness and falls at home. Patient was found to be dehydrated. He was hospitalized for further management.  Reason for Visit: Acute diarrhea, UTI, dehydration  Consultants: None  Procedures: None  Antibiotics: Ceftriaxone  Subjective/Interval History: Patient feels some better this morning. Denies any nausea, vomiting. Had a formed stool this morning. Complains of pain in his back from his recent fall. His son is at the bedside  ROS: Denies any headaches. No shortness of breath or chest pains.  Objective:  Vital Signs  Vitals:   08/04/16 1919 08/04/16 2135 08/05/16 0443 08/05/16 0640  BP:  105/90  (!) 95/53  Pulse:  92  62  Resp:  17  17  Temp:  98.3 F (36.8 C)  98.1 F (36.7 C)  TempSrc:  Oral  Oral  SpO2:  93%  96%  Weight: 76 kg (167 lb 8.8 oz)  77.9 kg (171 lb 11.8 oz)   Height: 5\' 7"  (1.702 m)       Intake/Output Summary (Last 24 hours) at 08/05/16 1330 Last data filed at 08/05/16 1043  Gross per 24 hour  Intake             1830 ml  Output              400 ml  Net             1430 ml   Filed Weights   08/04/16 1249 08/04/16 1919 08/05/16 0443  Weight: 74.8 kg (165 lb) 76 kg (167 lb 8.8 oz) 77.9 kg (171 lb 11.8 oz)    General appearance: alert, cooperative, appears stated age, distracted and no distress Resp: clear to auscultation bilaterally Cardio: regular rate and rhythm, S1, S2 normal, no murmur, click, rub or gallop GI: soft, non-tender; bowel sounds normal; no masses,  no organomegaly Back: Bruising noted on the sides. Nontender. Extremities:  extremities normal, atraumatic, no cyanosis or edema Neurologic: Awake and alert. Oriented to person, place, year, month. Cranial nerves II-12 intact. Motor strength equal bilateral upper and lower extremities. No lower extremity weakness identified.  Lab Results:  Data Reviewed: I have personally reviewed following labs and imaging studies  CBC:  Recent Labs Lab 08/04/16 1355  WBC 11.2*  NEUTROABS 9.5*  HGB 11.4*  HCT 33.2*  MCV 98.8  PLT 101*    Basic Metabolic Panel:  Recent Labs Lab 08/04/16 1355 08/05/16 0445  NA 129* 132*  K 3.7 3.7  CL 93* 97*  CO2 27 27  GLUCOSE 138* 113*  BUN 38* 41*  CREATININE 1.19 1.15  CALCIUM 8.9 8.5*    GFR: Estimated Creatinine Clearance: 39.9 mL/min (by C-G formula based on SCr of 1.15 mg/dL).  Liver Function Tests:  Recent Labs Lab 08/04/16 1355 08/05/16 0445  AST 72* 49*  ALT 42 34  ALKPHOS 68 59  BILITOT 1.7* 1.2  PROT 6.3* 5.1*  ALBUMIN 3.6 2.7*   CBG:  Recent Labs Lab 08/04/16 1755 08/04/16 2133 08/05/16 0828 08/05/16 1148  GLUCAP 211* 137* 114* 148*     Recent Results (from the past 240 hour(s))  MRSA PCR Screening     Status: None   Collection Time: 08/04/16  7:40 PM  Result Value Ref Range Status   MRSA by PCR NEGATIVE NEGATIVE Final    Comment:        The GeneXpert MRSA Assay (FDA approved for NASAL specimens only), is one component of a comprehensive MRSA colonization surveillance program. It is not intended to diagnose MRSA infection nor to guide or monitor treatment for MRSA infections.   C difficile quick screen w PCR reflex     Status: None   Collection Time: 08/05/16  4:42 AM  Result Value Ref Range Status   C Diff antigen NEGATIVE NEGATIVE Final   C Diff toxin NEGATIVE NEGATIVE Final   C Diff interpretation No C. difficile detected.  Final      Radiology Studies: No results found.   Medications:  Scheduled: . cefTRIAXone (ROCEPHIN)  IV  1 g Intravenous Q24H  . ferrous  sulfate  325 mg Oral BID WC  . insulin aspart  0-5 Units Subcutaneous QHS  . insulin aspart  0-9 Units Subcutaneous TID WC  . metoprolol succinate  50 mg Oral Daily  . multivitamin with minerals  1 tablet Oral Daily  . phenazopyridine  200 mg Oral TID  . tamsulosin  0.4 mg Oral QHS   Continuous: . sodium chloride 1,000 mL (08/05/16 1043)   [id number] **OR** acetaminophen, HYDROcodone-acetaminophen, labetalol, ondansetron **OR** ondansetron (ZOFRAN) IV  Assessment/Plan:  Principal Problem:   Dehydration Active Problems:   Chronic diastolic CHF (congestive heart failure) (HCC)   Hyponatremia   Diarrhea   Diabetes mellitus type II, non insulin dependent (HCC)   Hypertension   UTI (urinary tract infection)   Atrial fibrillation (HCC)   Normocytic anemia   Thrombocytopenia (HCC)    Watery diarrhea with dehydration  Diarrhea could be reflective of a viral process. Appears to have subsided. His abdomen is completely benign. C. difficile is negative. GI pathogen panel is pending. Continue to monitor.  UTI  Patient reported dysuria and increased urinary urgency/frequency. UA suggests infection; there is moderate Hgb on UA . Continue with ceftriaxone. Await culture data.  Hyponatremia  Serum sodium 129 on admission in the setting of dehydration. Improving with IV hydration.   Fall at home Does have some bruising in his back. However, nontender. Does not have any neurological deficits. Abdomen is completely benign. Hemoglobin is stable. Continue to monitor for now. Check CBC.  Chronic atrial fibrillation  Rate is well-controlled on admission. CHADS-VASc at least 5 (age x2, DM, HTN, CHF). Follows with cardiology; per cards notes, no anticoagulation d/t hx of anemia. Blood pressure noted to be somewhat on the lower side.   Chronic diastolic CHF  Dehydrated on admission. TTE (03/09/14) with EF 50-55%, severe LAE, mild MR, mod-severe TR, severely increased PA pressures.  Managed with Lasix 80 mg qD, Toprol, and trandolapril at home. Lasix and trandolapril held on admission d/t dehydration, will resume as appropriate.   Normocytic anemia, thrombocytopenia Hgb is stable on admission at 11.4. Anemia has been attributed to iron-deficiency and iron-supplementation will be continued. Platelets lower than priors at 101,000; infection may be responsible for the drop; there is no sign of bleeding    Essential Hypertension Blood pressure borderline low. Monitor closely. Appears to be asymptomatic at this time.  Type II DM  A1c was 6.9% in July 2015. Managed with metformin only at home; this will be held in the hospital. Sliding scale coverage.  DVT Prophylaxis: SCDs    Code Status: Full code  Family Communication: Discussed with the patient and his son  Disposition Plan: Management as outlined above. PT evaluation.     LOS: 1 day   [facility] Pager [phone] 08/05/2016, 1:30 PM  If 7PM-7AM, please contact night-coverage at [email or web address], password [facility]

## 2016-08-06 LAB — GASTROINTESTINAL PANEL BY PCR, STOOL (REPLACES STOOL CULTURE)
ADENOVIRUS F40/41: NOT DETECTED
Astrovirus: NOT DETECTED
CRYPTOSPORIDIUM: NOT DETECTED
Campylobacter species: NOT DETECTED
Cyclospora cayetanensis: NOT DETECTED
ENTEROPATHOGENIC E COLI (EPEC): NOT DETECTED
Entamoeba histolytica: NOT DETECTED
Enteroaggregative E coli (EAEC): NOT DETECTED
Enterotoxigenic E coli (ETEC): NOT DETECTED
Giardia lamblia: NOT DETECTED
Norovirus GI/GII: NOT DETECTED
PLESIMONAS SHIGELLOIDES: NOT DETECTED
ROTAVIRUS A: NOT DETECTED
SHIGELLA/ENTEROINVASIVE E COLI (EIEC): NOT DETECTED
Salmonella species: NOT DETECTED
Sapovirus (I, II, IV, and V): NOT DETECTED
Shiga like toxin producing E coli (STEC): NOT DETECTED
Vibrio cholerae: NOT DETECTED
Vibrio species: NOT DETECTED
YERSINIA ENTEROCOLITICA: NOT DETECTED

## 2016-08-06 LAB — BASIC METABOLIC PANEL
Anion gap: 7 (ref 5–15)
BUN: 33 mg/dL — AB (ref 6–20)
CHLORIDE: 98 mmol/L — AB (ref 101–111)
CO2: 24 mmol/L (ref 22–32)
CREATININE: 0.93 mg/dL (ref 0.61–1.24)
Calcium: 8.2 mg/dL — ABNORMAL LOW (ref 8.9–10.3)
GFR calc non Af Amer: >60 mL/min (ref >60)
Glucose, Bld: 114 mg/dL — ABNORMAL HIGH (ref 65–99)
POTASSIUM: 3.5 mmol/L (ref 3.5–5.1)
Sodium: 129 mmol/L — ABNORMAL LOW (ref 135–145)

## 2016-08-06 LAB — GLUCOSE, CAPILLARY
GLUCOSE-CAPILLARY: 111 mg/dL — AB (ref 65–99)
GLUCOSE-CAPILLARY: 126 mg/dL — AB (ref 65–99)
GLUCOSE-CAPILLARY: 111 mg/dL — AB (ref 65–99)
Glucose-Capillary: 138 mg/dL — ABNORMAL HIGH (ref 65–99)

## 2016-08-06 LAB — URINE CULTURE

## 2016-08-06 LAB — CBC
HEMATOCRIT: 30.1 % — AB (ref 39.0–52.0)
HEMOGLOBIN: 10.3 g/dL — AB (ref 13.0–17.0)
MCH: 33.3 pg (ref 26.0–34.0)
MCHC: 34.2 g/dL (ref 30.0–36.0)
MCV: 97.4 fL (ref 78.0–100.0)
Platelets: 111 10*3/uL — ABNORMAL LOW (ref 150–400)
RBC: 3.09 MIL/uL — AB (ref 4.22–5.81)
RDW: 13.2 % (ref 11.5–15.5)
WBC: 7.9 10*3/uL (ref 4.0–10.5)

## 2016-08-06 LAB — HEMOGLOBIN A1C
Hgb A1c MFr Bld: 5.4 % (ref 4.8–5.6)
MEAN PLASMA GLUCOSE: 108 mg/dL

## 2016-08-06 MED ORDER — PHENAZOPYRIDINE HCL 100 MG PO TABS
100.0000 mg | ORAL_TABLET | Freq: Three times a day (TID) | ORAL | Status: DC
  Administered 2016-08-06 – 2016-08-07 (×4): 100 mg via ORAL
  Filled 2016-08-06 (×4): qty 1

## 2016-08-06 MED ORDER — CEFPODOXIME PROXETIL 200 MG PO TABS
200.0000 mg | ORAL_TABLET | Freq: Two times a day (BID) | ORAL | Status: DC
  Administered 2016-08-06 – 2016-08-07 (×2): 200 mg via ORAL
  Filled 2016-08-06 (×3): qty 1

## 2016-08-06 MED ORDER — METOPROLOL TARTRATE 12.5 MG HALF TABLET
12.5000 mg | ORAL_TABLET | Freq: Two times a day (BID) | ORAL | Status: DC
  Administered 2016-08-06 – 2016-08-07 (×3): 12.5 mg via ORAL
  Filled 2016-08-06 (×3): qty 1

## 2016-08-06 NOTE — Evaluation (Signed)
Physical Therapy Evaluation Patient Details Name: [name] [name] MRN: [id number] DOB: 1923/11/22 Today's Date: 08/06/2016   History of Present Illness  A 82 year old Caucasian male with a past medical history of atrial fibrillation, not on anticoagulation, chronic diastolic CHF, iron deficiency anemia, hypertension and type 2 diabetes who presented to the emergency department with complaints of diarrhea, weakness and falls at home. Patient was found to be dehydrated.  Clinical Impression  Patient presents with decreased independence and safety with mobility due to deficits listed in PT problem list.  He will benefit from skilled PT in the acute setting to allow return home with family support following STSNF level rehab.     Follow Up Recommendations SNF    Equipment Recommendations  Rolling walker with 5" wheels    Recommendations for Other Services       Precautions / Restrictions Precautions Precautions: Fall      Mobility  Bed Mobility Overal bed mobility: Needs Assistance Bed Mobility: Supine to Sit     Supine to sit: Min guard     General bed mobility comments: for safety  Transfers Overall transfer level: Needs assistance Equipment used: Rolling walker (2 wheeled) Transfers: Sit to/from Stand Sit to Stand: Min guard         General transfer comment: assist for balance/safety  Ambulation/Gait Ambulation/Gait assistance: Min assist Ambulation Distance (Feet): 125 Feet Assistive device: Rolling walker (2 wheeled) Gait Pattern/deviations: Step-through pattern;Trunk flexed;Decreased stride length;Shuffle     General Gait Details: slow pace, fatigues quickly, but no evidence of buckling, walker assists to maintain balance with flexed trunk and shuffling steps  Stairs            Wheelchair Mobility    Modified Rankin (Stroke Patients Only)       Balance Overall balance assessment: Needs assistance   Sitting balance-[name]: Good     Standing  balance support: Bilateral upper extremity supported Standing balance-[name]: Poor Standing balance comment: UE support or assist for balance                             Pertinent Vitals/Pain Pain Assessment: No/denies pain    Home Living Family/patient expects to be discharged to:: Skilled nursing facility Living Arrangements: Spouse/significant other Available Help at Discharge: Family Type of Home: Independent living facility Home Access: Level entry     Home Layout: One level Home Equipment: Environmental consultant - 2 wheels;Shower seat;Grab bars - tub/shower;Grab bars - toilet;Hand held shower head (wife uses it too, need a second one) Additional Comments: [place] [facility]; plans to do rehab prior to returning to [facility]    Prior Function Level of Independence: Independent               Journalist, newspaper        Extremity/Trunk Assessment   Upper Extremity Assessment: Generalized weakness           Lower Extremity Assessment: Generalized weakness         Communication   Communication: No difficulties  Cognition Arousal/Alertness: Awake/alert Behavior During Therapy: WFL for tasks assessed/performed Overall Cognitive Status: Impaired/Different from baseline Area of Impairment: Orientation Orientation Level:  (intermittent confusion)                  General Comments      Exercises     Assessment/Plan    PT Assessment Patient needs continued PT services  PT Problem List Decreased strength;Decreased cognition;Decreased  activity tolerance;Decreased balance;Decreased knowledge of use of DME;Decreased mobility;Decreased safety awareness          PT Treatment Interventions DME instruction;Therapeutic activities;Gait training;Therapeutic exercise;Patient/family education;Balance training;Functional mobility training    PT Goals (Current goals can be found in the Care Plan section)  Acute Rehab PT Goals Patient Stated Goal: To  go to rehab prior to return home PT Goal Formulation: With patient/family Time For Goal Achievement: 08/14/16 Potential to Achieve Goals: Good    Frequency Min 3X/week   Barriers to discharge        Co-evaluation               End of Session Equipment Utilized During Treatment: Gait belt Activity Tolerance: Patient limited by fatigue Patient left: in chair;with call bell/phone within reach;with nursing/sitter in room Nurse Communication: Mobility status         Time: [id number] PT Time Calculation (min) (ACUTE ONLY): 15 min   Charges:   PT Evaluation $PT Eval Moderate Complexity: 1 Procedure     PT G CodesReginia Naas August 29, 2016, 5:08 PM  Magda Kiel, [place] 08-29-2016

## 2016-08-06 NOTE — Progress Notes (Signed)
[facility] PROGRESS NOTE  Lual Belaire [id number] DOB: 1924/04/04 DOA: 08/04/2016  PCP: Arlyss Repress, MD  Brief History/Interval Summary: A 82 year old Caucasian male with a past medical history of atrial fibrillation, not on anticoagulation, chronic diastolic CHF, iron deficiency anemia, hypertension and type 2 diabetes who presented to the emergency department with complaints of diarrhea, weakness and falls at home. Patient was found to be dehydrated. He was hospitalized for further management.  Reason for Visit: Acute diarrhea, UTI, dehydration  Consultants: None  Procedures: None  Antibiotics: Ceftriaxone. Will be changed to [employer] today.  Subjective/Interval History: Patient feels well. Still feels quite fatigued. Denies any nausea, vomiting. Diarrhea appears to have subsided.  ROS: Denies any headaches. No shortness of breath or chest pains.  Objective:  Vital Signs  Vitals:   08/05/16 0640 08/05/16 1447 08/05/16 2029 08/06/16 0517  BP: (!) 95/53 (!) 103/57 (!) 108/56 (!) 92/57  Pulse: 62 73 60 (!) 57  Resp: 17 16 18 17   Temp: 98.1 F (36.7 C) 97.7 F (36.5 C) 99.8 F (37.7 C) 97.8 F (36.6 C)  TempSrc: Oral Oral Oral Oral  SpO2: 96% 99% 93% 95%  Weight:      Height:        Intake/Output Summary (Last 24 hours) at 08/06/16 0824 Last data filed at 08/05/16 1836  Gross per 24 hour  Intake           554.17 ml  Output                0 ml  Net           554.17 ml   Filed Weights   08/04/16 1249 08/04/16 1919 08/05/16 0443  Weight: 74.8 kg (165 lb) 76 kg (167 lb 8.8 oz) 77.9 kg (171 lb 11.8 oz)    General appearance: alert, cooperative, appears stated age, distracted and no distress Resp: clear to auscultation bilaterally Cardio: regular rate and rhythm, S1, S2 normal, no murmur, click, rub or gallop GI: soft, non-tender; bowel sounds normal; no masses,  no organomegaly Back: Bruising noted on the sides. Nontender.Stable compared to  yesterday. Neurologic: Awake and alert. Oriented to person, place, year, month. Cranial nerves II-12 intact. Motor strength equal bilateral upper and lower extremities. No lower extremity weakness identified.  Lab Results:  Data Reviewed: I have personally reviewed following labs and imaging studies  CBC:  Recent Labs Lab 08/04/16 1355 08/05/16 1421 08/06/16 0511  WBC 11.2* 8.9 7.9  NEUTROABS 9.5*  --   --   HGB 11.4* 11.0* 10.3*  HCT 33.2* 32.1* 30.1*  MCV 98.8 97.6 97.4  PLT 101* 96* 111*    Basic Metabolic Panel:  Recent Labs Lab 08/04/16 1355 08/05/16 0445 08/06/16 0511  NA 129* 132* 129*  K 3.7 3.7 3.5  CL 93* 97* 98*  CO2 27 27 24   GLUCOSE 138* 113* 114*  BUN 38* 41* 33*  CREATININE 1.19 1.15 0.93  CALCIUM 8.9 8.5* 8.2*    GFR: Estimated Creatinine Clearance: 49.4 mL/min (by C-G formula based on SCr of 0.93 mg/dL).  Liver Function Tests:  Recent Labs Lab 08/04/16 1355 08/05/16 0445  AST 72* 49*  ALT 42 34  ALKPHOS 68 59  BILITOT 1.7* 1.2  PROT 6.3* 5.1*  ALBUMIN 3.6 2.7*   CBG:  Recent Labs Lab 08/05/16 0828 08/05/16 1148 08/05/16 1725 08/05/16 2126 08/06/16 0820  GLUCAP 114* 148* 140* 139* 111*     Recent Results (from the past 240 hour(s))  Urine culture  Status: Abnormal   Collection Time: 08/04/16  2:08 PM  Result Value Ref Range Status   Specimen Description URINE, RANDOM  Final   Special Requests NONE  Final   Culture >=100,000 COLONIES/mL ESCHERICHIA COLI (A)  Final   Report Status 08/06/2016 FINAL  Final   Organism ID, Bacteria ESCHERICHIA COLI (A)  Final      Susceptibility   Escherichia coli - MIC*    AMPICILLIN <=2 SENSITIVE Sensitive     CEFAZOLIN <=4 SENSITIVE Sensitive     CEFTRIAXONE <=1 SENSITIVE Sensitive     CIPROFLOXACIN <=0.25 SENSITIVE Sensitive     GENTAMICIN <=1 SENSITIVE Sensitive     IMIPENEM <=0.25 SENSITIVE Sensitive     NITROFURANTOIN <=16 SENSITIVE Sensitive     TRIMETH/SULFA <=20 SENSITIVE  Sensitive     AMPICILLIN/SULBACTAM <=2 SENSITIVE Sensitive     PIP/TAZO <=4 SENSITIVE Sensitive     Extended ESBL NEGATIVE Sensitive     * >=100,000 COLONIES/mL ESCHERICHIA COLI  MRSA PCR Screening     Status: None   Collection Time: 08/04/16  7:40 PM  Result Value Ref Range Status   MRSA by PCR NEGATIVE NEGATIVE Final    Comment:        The GeneXpert MRSA Assay (FDA approved for NASAL specimens only), is one component of a comprehensive MRSA colonization surveillance program. It is not intended to diagnose MRSA infection nor to guide or monitor treatment for MRSA infections.   Gastrointestinal Panel by PCR , Stool     Status: None   Collection Time: 08/05/16  4:42 AM  Result Value Ref Range Status   Campylobacter species NOT DETECTED NOT DETECTED Final   Plesimonas shigelloides NOT DETECTED NOT DETECTED Final   Salmonella species NOT DETECTED NOT DETECTED Final   Yersinia enterocolitica NOT DETECTED NOT DETECTED Final   Vibrio species NOT DETECTED NOT DETECTED Final   Vibrio cholerae NOT DETECTED NOT DETECTED Final   Enteroaggregative E coli (EAEC) NOT DETECTED NOT DETECTED Final   Enteropathogenic E coli (EPEC) NOT DETECTED NOT DETECTED Final   Enterotoxigenic E coli (ETEC) NOT DETECTED NOT DETECTED Final   Shiga like toxin producing E coli (STEC) NOT DETECTED NOT DETECTED Final   Shigella/Enteroinvasive E coli (EIEC) NOT DETECTED NOT DETECTED Final   Cryptosporidium NOT DETECTED NOT DETECTED Final   Cyclospora cayetanensis NOT DETECTED NOT DETECTED Final   Entamoeba histolytica NOT DETECTED NOT DETECTED Final   Giardia lamblia NOT DETECTED NOT DETECTED Final   Adenovirus F40/41 NOT DETECTED NOT DETECTED Final   Astrovirus NOT DETECTED NOT DETECTED Final   Norovirus GI/GII NOT DETECTED NOT DETECTED Final   Rotavirus A NOT DETECTED NOT DETECTED Final   Sapovirus (I, II, IV, and V) NOT DETECTED NOT DETECTED Final  C difficile quick screen w PCR reflex     Status: None    Collection Time: 08/05/16  4:42 AM  Result Value Ref Range Status   C Diff antigen NEGATIVE NEGATIVE Final   C Diff toxin NEGATIVE NEGATIVE Final   C Diff interpretation No C. difficile detected.  Final      Radiology Studies: No results found.   Medications:  Scheduled: . cefTRIAXone (ROCEPHIN)  IV  1 g Intravenous Q24H  . ferrous sulfate  325 mg Oral BID WC  . insulin aspart  0-5 Units Subcutaneous QHS  . insulin aspart  0-9 Units Subcutaneous TID WC  . metoprolol tartrate  12.5 mg Oral BID  . multivitamin with minerals  1 tablet Oral Daily  .  phenazopyridine  100 mg Oral TID  . tamsulosin  0.4 mg Oral QHS   Continuous:  [id number] **OR** acetaminophen, HYDROcodone-acetaminophen, ondansetron **OR** ondansetron (ZOFRAN) IV  Assessment/Plan:  Principal Problem:   Dehydration Active Problems:   Chronic diastolic CHF (congestive heart failure) (HCC)   Hyponatremia   Diarrhea   Diabetes mellitus type II, non insulin dependent (HCC)   Hypertension   UTI (urinary tract infection)   Atrial fibrillation (HCC)   Normocytic anemia   Thrombocytopenia (HCC)    Watery diarrhea with dehydration  Diarrhea could be reflective of a viral process. Appears to have subsided. His abdomen is completely benign. C. difficile is negative. GI pathogen panel is Negative. Okay to discontinue) precautions.  UTI with Escherichia coli Patient reported dysuria and increased urinary urgency/frequency. UA suggests infection. Patient was started on ceftriaxone. Cultures are positive for Escherichia coli. Sensitivities reviewed. Change to Vantin.   Hyponatremia  Serum sodium 129 on admission in the setting of dehydration. Stable.  Fall at home Does have some bruising in his back. Does not have any neurological deficits. Abdomen is completely benign. Hemoglobin is stable. Continue to monitor for now. Hemoglobin is stable. Some drop noted which is likely due to dilution from  hydration.  Chronic atrial fibrillation  Rate is well-controlled on admission. CHADS-VASc at least 5 (age x2, DM, HTN, CHF). Follows with cardiology; per cards notes, no anticoagulation d/t hx of anemia. Blood pressure noted to be somewhat on the lower side.   Chronic diastolic CHF  Dehydrated on admission. TTE (03/09/14) with EF 50-55%, severe LAE, mild MR, mod-severe TR, severely increased PA pressures. Managed with Lasix 80 mg qD, Toprol, and trandolapril at home. Lasix and trandolapril held on admission d/t dehydration, will resume as appropriate. IV fluids discontinued.  Normocytic anemia, thrombocytopenia Mild drop in hemoglobin is likely due to dilution. No overt bleeding noted. Anemia has been attributed to iron-deficiency and iron-supplementation will be continued. Platelet counts are stable.    Essential Hypertension Blood pressure borderline low. Monitor closely. Appears to be asymptomatic at this time. Hold other blood pressure lowering agents except for metoprolol, the dose of which will also be reduced.  Type II DM  A1c was 6.9% in July 2015. Managed with metformin only at home; this will be held in the hospital. Sliding scale coverage.  DVT Prophylaxis: SCDs    Code Status: Full code  Family Communication: Discussed with the patient and his son Disposition Plan: PT evaluation. Change to oral antibiotics today. Possible discharge tomorrow.     LOS: 2 days   [place] [facility] Pager [phone] 08/06/2016, 8:24 AM  If 7PM-7AM, please contact night-coverage at [email or web address], password [facility]

## 2016-08-06 NOTE — Clinical Social Work Note (Signed)
Clinical Social Work Assessment  Patient Details  Name: [name] [name] MRN: [id number] Date of Birth: 1923-12-23  Date of referral:  08/06/16               Reason for consult:  Facility Placement                Permission sought to share information with:  Facility Sport and exercise psychologist, Family Supports Permission granted to share information::  Yes, Verbal Permission Granted  Name::     Leisure centre manager::  [facility]  Relationship::  Spouse  Contact Information:  [phone]  Housing/Transportation Living arrangements for the past 2 months:  [place] of Information:  Adult Children, Spouse, Patient Patient Interpreter Needed:  None Criminal Activity/Legal Involvement Pertinent to Current Situation/Hospitalization:  No - Comment as needed Significant Relationships:  Adult Children, Spouse Lives with:  Spouse Do you feel safe going back to the place where you live?  No Need for family participation in patient care:  Yes (Comment)  Care giving concerns:  CSW received consult for possible SNF placement at time of discharge. CSW met with patient and patient's spouse and son at bedside regarding recommendation of SNF placement at time of discharge. Per patient's spouse, patient's spouse is currently unable to care for patient at their home ([facility]) given patient's current physical needs and fall risk. Patient and patient's spouse expressed understanding of recommendation and are agreeable to SNF placement at time of discharge. CSW to continue to follow and assist with discharge planning needs.   Social Worker assessment / plan:  CSW spoke with patient and patient's spouse concerning possibility of rehab at [facility] before returning home.  Employment status:  Retired Forensic scientist:  Medicare PT Recommendations:  Not assessed at this time [place] / Referral to community resources:  [place]  Patient/Family's  Response to care:  Patient and patient's spouse recognize need for rehab before returning home and are agreeable to a SNF in [place]. Patient reported preference for [facility], where they reside in independent living. Patient's spouse and son were appreciative of CSW help.  Patient/Family's Understanding of and Emotional Response to Diagnosis, Current Treatment, and Prognosis:  Patient/family is realistic regarding therapy needs and expressed being hopeful for SNF placement. Patient expressed understanding of CSW role and discharge process. No questions/concerns about plan or treatment.    Emotional Assessment Appearance:  Appears stated age Attitude/Demeanor/Rapport:  Other (Appropriate) Affect (typically observed):  Accepting, Appropriate Orientation:  Oriented to Self, Oriented to Situation, Oriented to Place, Oriented to  Time Alcohol / Substance use:  Not Applicable Psych involvement (Current and /or in the community):  No (Comment)  Discharge Needs  Concerns to be addressed:  Care Coordination Readmission within the last 30 days:  No Current discharge risk:  None Barriers to Discharge:  Continued Medical Work up   Merrill [name], [place] 08/06/2016, 11:57 AM

## 2016-08-06 NOTE — NC FL2 (Signed)
[place] MEDICAID FL2 LEVEL OF CARE SCREENING TOOL     IDENTIFICATION  Patient Name: [name] [name] Birthdate: 05-08-1924 Sex: male Admission Date (Current Location): 08/04/2016  [facility] and [place] Number:  Herbalist and Address:  [facility], [place] [address] [place], [place] [address]      Provider Number: [id number]  Attending Physician Name and Address:  Bonnielee Haff, MD  Relative Name and Phone Number:  Bonnita Nasuti, spouse, [phone]    Current Level of Care: Hospital Recommended Level of Care: [place] Prior Approval Number:    Date Approved/Denied:   PASRR Number: [id number]  Discharge Plan: SNF    Current Diagnoses: Patient Active Problem List   Diagnosis Date Noted  . Hyponatremia 08/04/2016  . Dehydration 08/04/2016  . Diarrhea 08/04/2016  . Diabetes mellitus type II, non insulin dependent ([place]) 08/04/2016  . Hypertension 08/04/2016  . UTI (urinary tract infection) 08/04/2016  . Atrial fibrillation ([place]) 08/04/2016  . Normocytic anemia 08/04/2016  . Thrombocytopenia ([place]) 08/04/2016  . Tachy-brady syndrome ([place]) 07/14/2015  . Pacemaker 07/14/2015  . Chronic diastolic CHF (congestive heart failure) ([place])   . Dyspnea 03/31/2014  . Symptomatic anemia 03/31/2014    Orientation RESPIRATION BLADDER Height & Weight     Self, Situation, Place  Normal Continent Weight: 77.9 kg (171 lb 11.8 oz) Height:  5\' 7"  (170.2 cm)  BEHAVIORAL SYMPTOMS/MOOD NEUROLOGICAL BOWEL NUTRITION STATUS      Continent Diet (Please see DC Summary)  AMBULATORY STATUS COMMUNICATION OF NEEDS Skin   Limited Assist Verbally Normal                       Personal Care Assistance Level of Assistance  Bathing, Feeding, Dressing Bathing Assistance: Maximum assistance Feeding assistance: Limited assistance Dressing Assistance: Limited assistance     Functional Limitations Info             SPECIAL CARE FACTORS FREQUENCY  PT  (By licensed PT)     PT Frequency: not yet assessed              Contractures      Additional Factors Info  Code Status, Allergies, Insulin Sliding Scale Code Status Info: DNR Allergies Info: NKA   Insulin Sliding Scale Info: insulin aspart (novoLOG) injection 0-5 Units;insulin aspart (novoLOG) injection 0-9 Units       Current Medications (08/06/2016):  This is the current hospital active medication list Current Facility-Administered Medications  Medication Dose Route Frequency Provider Last Rate Last Dose  . acetaminophen (TYLENOL) tablet 650 mg  650 mg Oral Q6H PRN Vianne Bulls, MD       Or  . acetaminophen (TYLENOL) suppository 650 mg  650 mg Rectal Q6H PRN Vianne Bulls, MD      . cefTRIAXone (ROCEPHIN) 1 g in dextrose 5 % 50 mL IVPB  1 g Intravenous Q24H Romona Curls, RPH   1 g at 08/05/16 1927  . ferrous sulfate tablet 325 mg  325 mg Oral BID WC Vianne Bulls, MD   325 mg at 08/06/16 0843  . HYDROcodone-acetaminophen (NORCO/VICODIN) 5-325 MG per tablet 1-2 tablet  1-2 tablet Oral Q4H PRN Ilene Qua Opyd, MD      . insulin aspart (novoLOG) injection 0-5 Units  0-5 Units Subcutaneous QHS Timothy S Opyd, MD      . insulin aspart (novoLOG) injection 0-9 Units  0-9 Units Subcutaneous TID WC Vianne Bulls, MD   1  Units at 08/05/16 1754  . metoprolol tartrate (LOPRESSOR) tablet 12.5 mg  12.5 mg Oral BID Bonnielee Haff, MD   12.5 mg at 08/06/16 0843  . multivitamin with minerals tablet 1 tablet  1 tablet Oral Daily Vianne Bulls, MD   1 tablet at 08/06/16 [phone]  . ondansetron (ZOFRAN) tablet 4 mg  4 mg Oral Q6H PRN Vianne Bulls, MD       Or  . ondansetron (ZOFRAN) injection 4 mg  4 mg Intravenous Q6H PRN Vianne Bulls, MD      . phenazopyridine (PYRIDIUM) tablet 100 mg  100 mg Oral TID Bonnielee Haff, MD   100 mg at 08/06/16 0842  . tamsulosin (FLOMAX) capsule 0.4 mg  0.4 mg Oral QHS Vianne Bulls, MD   0.4 mg at 08/05/16 2122     Discharge Medications: Please see  discharge summary for a list of discharge medications.  Relevant Imaging Results:  Relevant Lab Results:   Additional Information SSN: [place], [place]

## 2016-08-07 LAB — BASIC METABOLIC PANEL
Anion gap: 8 (ref 5–15)
BUN: 33 mg/dL — ABNORMAL HIGH (ref 6–20)
CHLORIDE: 98 mmol/L — AB (ref 101–111)
CO2: 25 mmol/L (ref 22–32)
CREATININE: 0.89 mg/dL (ref 0.61–1.24)
Calcium: 8.7 mg/dL — ABNORMAL LOW (ref 8.9–10.3)
GFR calc non Af Amer: >60 mL/min (ref >60)
Glucose, Bld: 108 mg/dL — ABNORMAL HIGH (ref 65–99)
POTASSIUM: 3.9 mmol/L (ref 3.5–5.1)
SODIUM: 131 mmol/L — AB (ref 135–145)

## 2016-08-07 LAB — CBC
HCT: 29.6 % — ABNORMAL LOW (ref 39.0–52.0)
HEMOGLOBIN: 10 g/dL — AB (ref 13.0–17.0)
MCH: 33.1 pg (ref 26.0–34.0)
MCHC: 33.8 g/dL (ref 30.0–36.0)
MCV: 98 fL (ref 78.0–100.0)
Platelets: 118 10*3/uL — ABNORMAL LOW (ref 150–400)
RBC: 3.02 MIL/uL — AB (ref 4.22–5.81)
RDW: 13.6 % (ref 11.5–15.5)
WBC: 8.2 10*3/uL (ref 4.0–10.5)

## 2016-08-07 LAB — GLUCOSE, CAPILLARY: GLUCOSE-CAPILLARY: 127 mg/dL — AB (ref 65–99)

## 2016-08-07 MED ORDER — CEFPODOXIME PROXETIL 200 MG PO TABS: 200.0000 mg | ORAL_TABLET | Freq: Two times a day (BID) | ORAL | 0 refills | Status: AC

## 2016-08-07 MED ORDER — ACETAMINOPHEN 325 MG PO TABS: 650.0000 mg | ORAL_TABLET | Freq: Four times a day (QID) | ORAL | Status: AC | PRN

## 2016-08-07 MED ORDER — METOPROLOL SUCCINATE ER 25 MG PO TB24: 25.0000 mg | ORAL_TABLET | Freq: Every day | ORAL | Status: AC

## 2016-08-07 NOTE — Discharge Instructions (Signed)
Urinary Tract Infection, Adult Introduction A urinary tract infection (UTI) is an infection of any part of the urinary tract. The urinary tract includes the:  Kidneys.  Ureters.  Bladder.  Urethra. These organs make, store, and get rid of pee (urine) in the body. Follow these instructions at home:  Take over-the-counter and prescription medicines only as told by your doctor.  If you were prescribed an antibiotic medicine, take it as told by your doctor. Do not stop taking the antibiotic even if you start to feel better.  Avoid the following drinks:  Alcohol.  Caffeine.  Tea.  Carbonated drinks.  Drink enough fluid to keep your pee clear or pale yellow.  Keep all follow-up visits as told by your doctor. This is important.  Make sure to:  Empty your bladder often and completely. Do not to hold pee for long periods of time.  Empty your bladder before and after sex.  Wipe from front to back after a bowel movement if you are male. Use each tissue one time when you wipe. Contact a doctor if:  You have back pain.  You have a fever.  You feel sick to your stomach (nauseous).  You throw up (vomit).  Your symptoms do not get better after 3 days.  Your symptoms go away and then come back. Get help right away if:  You have very bad back pain.  You have very bad lower belly (abdominal) pain.  You are throwing up and cannot keep down any medicines or water. This information is not intended to replace advice given to you by your health care provider. Make sure you discuss any questions you have with your health care provider. Document Released: 02/06/2008 Document Revised: 01/26/2016 Document Reviewed: 07/11/2015  2017 Elsevier  

## 2016-08-07 NOTE — Evaluation (Signed)
Occupational Therapy Evaluation Patient Details Name: [name] [name] MRN: [id number] DOB: 22-Feb-1924 Today's Date: 08/07/2016    History of Present Illness A 82 year old Caucasian male with a past medical history of atrial fibrillation, not on anticoagulation, chronic diastolic CHF, iron deficiency anemia, hypertension and type 2 diabetes who presented to the emergency department with complaints of diarrhea, weakness and falls at home. Patient was found to be dehydrated.   Clinical Impression   Pt admitted with the above diagnosis and has the deficits listed below. Pt would benefit from cont OT to increase independence with basic adls and adl transfers so he can d/c back to independent living with his wife after rehab.      Follow Up Recommendations  SNF;Supervision/Assistance - 24 hour    Equipment Recommendations  None recommended by OT    Recommendations for Other Services       Precautions / Restrictions Precautions Precautions: Fall Restrictions Weight Bearing Restrictions: No      Mobility Bed Mobility Overal bed mobility: Needs Assistance Bed Mobility: Supine to Sit     Supine to sit: Supervision     General bed mobility comments: for safety  Transfers Overall transfer level: Needs assistance Equipment used: Rolling walker (2 wheeled) Transfers: Sit to/from Stand Sit to Stand: Supervision         General transfer comment: encouraged pt to slow down and make sure to give himself a few moments before moving after changing positions.    Balance Overall balance assessment: Needs assistance Sitting-balance support: Feet supported;Bilateral upper extremity supported Sitting balance-[name]: Good     Standing balance support: Bilateral upper extremity supported;During functional activity Standing balance-[name]: Poor Standing balance comment: Pt needs walker to ensure safety in standing.                            ADL Overall ADL's :  Needs assistance/impaired Eating/Feeding: Independent;Sitting   Grooming: Wash/dry hands;Wash/dry face;Oral care;Min guard;Standing   Upper Body Bathing: Set up;Sitting   Lower Body Bathing: Moderate assistance;Sit to/from stand Lower Body Bathing Details (indicate cue type and reason): assist to reach areas below knees Upper Body Dressing : Set up;Sitting   Lower Body Dressing: Moderate assistance;Sit to/from stand Lower Body Dressing Details (indicate cue type and reason): assist to donn socks and shoes as well as to start pants over B feet. Toilet Transfer: Min guard;Ambulation;Comfort height toilet;RW;Grab bars   Toileting- Clothing Manipulation and Hygiene: Supervision/safety;Sitting/lateral lean       Functional mobility during ADLs: Min guard;Rolling walker General ADL Comments: Pt requires some assist wtih LE dressing.  Pt stated his back was sore from fall.     Vision Vision Assessment?: No apparent visual deficits   Perception     Praxis      Pertinent Vitals/Pain Pain Assessment: Faces Faces Pain Scale: Hurts little more Pain Location: back from fall Pain Descriptors / Indicators: Sore Pain Intervention(s): Limited activity within patient's tolerance;Monitored during session;Repositioned     Hand Dominance Right   Extremity/Trunk Assessment Upper Extremity Assessment Upper Extremity Assessment: Overall WFL for tasks assessed   Lower Extremity Assessment Lower Extremity Assessment: Defer to PT evaluation   Cervical / Trunk Assessment Cervical / Trunk Assessment: Kyphotic   Communication Communication Communication: No difficulties   Cognition Arousal/Alertness: Awake/alert Behavior During Therapy: WFL for tasks assessed/performed Overall Cognitive Status: History of cognitive impairments - at baseline       Memory: Decreased short-term memory  General Comments       Exercises       Shoulder Instructions      Home Living  Family/patient expects to be discharged to:: Assisted living Living Arrangements: Spouse/significant other Available Help at Discharge: Family Type of Home: Independent living facility Home Access: Level entry     Home Layout: One level     Bathroom Shower/Tub: Occupational psychologist: Handicapped height     Home Equipment: Environmental consultant - 2 wheels;Shower seat;Grab bars - tub/shower;Grab bars - toilet;Hand held shower head   Additional Comments: [place] at [facility]; plans to do rehab prior to returning to [facility]      Prior Functioning/Environment Level of Independence: Independent                 OT Problem List: Impaired balance (sitting and/or standing);Decreased knowledge of use of DME or AE;Pain   OT Treatment/Interventions: Self-care/ADL training;Therapeutic activities;DME and/or AE instruction    OT Goals(Current goals can be found in the care plan section) Acute Rehab OT Goals Patient Stated Goal: To go to rehab prior to return home OT Goal Formulation: With patient/family Time For Goal Achievement: 08/14/16 Potential to Achieve Goals: Good ADL Goals Pt Will Perform Lower Body Bathing: with supervision;sit to/from stand Pt Will Perform Lower Body Dressing: with supervision;sit to/from stand Pt Will Perform Tub/Shower Transfer: Shower transfer;ambulating;rolling walker;3 in 1;with supervision Additional ADL Goal #1: Pt will walk to bathroom with walker and toilet with S.  OT Frequency: Min 2X/week   Barriers to D/C: Decreased caregiver support  only wife available to assist.       Co-evaluation              End of Session Equipment Utilized During Treatment: Surveyor, mining Communication: Mobility status;Other (comment) (need to order walker)  Activity Tolerance: Patient tolerated treatment well Patient left: in bed;with call bell/phone within reach;with family/visitor present   Time: 1029-1106 OT Time Calculation (min): 37  min Charges:  OT General Charges $OT Visit: 1 Procedure OT Evaluation $OT Eval Moderate Complexity: 1 Procedure OT Treatments $Self Care/Home Management : 8-22 mins G-Codes:    [name] [name] August 20, 2016, 11:16 AM  [phone]

## 2016-08-07 NOTE — Progress Notes (Signed)
Patient will DC to: [facility] Anticipated DC date: 08/07/16 Family notified: Spouse Transport by: Car   Per MD patient ready for DC to [facility]. RN, patient, patient's family, and facility notified of DC. Discharge Summary sent to facility. RN given number for report.   CSW signing off.  Cedric Fishman, [place] Social Worker [phone]

## 2016-08-07 NOTE — Care Management Note (Signed)
Case Management Note  Patient Details  Name: [name] [name] MRN: [id number] Date of Birth: 1923/11/12  Subjective/Objective:   Pt past medical history of atrial fibrillation, CHF, iron deficiency anemia,hypertension and type 2 diabetes who presented with complaints of diarrhea, weakness and falls at home.Found to be dehydration.      Action/Plan: Plan is to d/c to SNF today.  Expected Discharge Date:    08/07/2016           Expected Discharge Plan:   SNF/ [facility]  In-House Referral:  Clinical Social Work  Discharge planning Services  CM Consult   Status of Service:  Completed, signed off  If discussed at [employer], dates discussed:    Additional Comments:  Sharin Mons, RN 08/07/2016, 9:42 AM

## 2016-08-07 NOTE — Clinical Social Work Placement (Signed)
   CLINICAL SOCIAL WORK PLACEMENT  NOTE  Date:  08/07/2016  Patient Details  Name: [name] [name] MRN: [id number] Date of Birth: 16-Aug-1924  Clinical Social Work is seeking post-discharge placement for this patient at the [place] level of care (*CSW will initial, date and re-position this form in  chart as items are completed):  Yes   Patient/family provided with [facility] Work Department's list of facilities offering this level of care within the geographic area requested by the patient (or if unable, by the patient's family).  Yes   Patient/family informed of their freedom to choose among providers that offer the needed level of care, that participate in Medicare, Medicaid or managed care program needed by the patient, have an available bed and are willing to accept the patient.  Yes   Patient/family informed of [facility]'s ownership interest in [facility] and [facility], as well as of the fact that they are under no obligation to receive care at these facilities.  PASRR submitted to EDS on       PASRR number received on       Existing PASRR number confirmed on 08/06/16     FL2 transmitted to all facilities in geographic area requested by pt/family on 08/06/16     FL2 transmitted to all facilities within larger geographic area on       Patient informed that his/her managed care company has contracts with or will negotiate with certain facilities, including the following:        Yes   Patient/family informed of bed offers received.  Patient chooses bed at [facility]     Physician recommends and patient chooses bed at      Patient to be transferred to [facility] [place] on 08/07/16.  Patient to be transferred to facility by Car     Patient family notified on 08/07/16 of transfer.  Name of family member notified:  Spouse     PHYSICIAN       Additional Comment:     _______________________________________________ [name], [place] 08/07/2016, 12:05 PM

## 2016-08-07 NOTE — Progress Notes (Signed)
[name] [name] to be D/C'd Skilled nursing facility per MD order.  Discussed with the patient and all questions fully answered.  VSS, Skin clean, dry. IV catheter discontinued intact. Site without signs and symptoms of complications. Dressing and pressure applied.  An After Visit Summary was printed and given to the patient. Patient received prescription.  D/c education completed with patient/family including follow up instructions, medication list, d/c activities limitations if indicated, with other d/c instructions as indicated by MD - patient able to verbalize understanding, all questions fully answered.   Patient instructed to return to ED, call 911, or call MD for any changes in condition.   Patient escorted via [place], and D/C home via private auto.  [name] 08/07/2016 12:35 PM

## 2016-08-07 NOTE — Discharge Summary (Signed)
[facility]  Physician Discharge Summary   Patient ID: [name] [name] MRN: [id number] DOB/AGE: 80/22/1925 82 y.o.  Admit date: 08/04/2016 Discharge date: 08/07/2016  PCP: Arlyss Repress, MD  DISCHARGE DIAGNOSES:  Principal Problem:   Dehydration Active Problems:   Chronic diastolic CHF (congestive heart failure) (HCC)   Hyponatremia   Diarrhea   Diabetes mellitus type II, non insulin dependent (HCC)   Hypertension   UTI (urinary tract infection)   Atrial fibrillation (HCC)   Normocytic anemia   Thrombocytopenia (HCC)   RECOMMENDATIONS FOR OUTPATIENT FOLLOW UP: 1. CBC and basic metabolic panel in 1 week 2. Monitor blood pressures closely. Please note that the dose of his metoprolol has been reduced. Also note that one of his blood pressure medication has been held. 3. Unclear if the patient was taking Lasix at home or not. Please consider resuming at a much lower dose Lasix in the next few days if he was indeed taking.    DISCHARGE CONDITION: fair  Diet recommendation: Modified carbohydrate  Filed Weights   08/04/16 1919 08/05/16 0443 08/07/16 0515  Weight: 76 kg (167 lb 8.8 oz) 77.9 kg (171 lb 11.8 oz) 77.6 kg (171 lb 1.2 oz)    INITIAL HISTORY: A 82 year old Caucasian male with a past medical history of atrial fibrillation, not on anticoagulation, chronic diastolic CHF, iron deficiency anemia, hypertension and type 2 diabetes who presented to the emergency department with complaints of diarrhea, weakness and falls at home. Patient was found to be dehydrated. He was hospitalized for further management.  Consultations:  None  Procedures:  None  HOSPITAL COURSE:   Watery diarrhea with dehydration  Diarrhea could be reflective of a viral process. Diarrhea has subsided. His abdomen is benign. He is tolerating oral intake. C. difficile is negative. GI pathogen panel is Negative. Did experience some deconditioning because of his acute illness. Patient seen by  physical therapy. Recommend short-term rehabilitation at a skilled nursing facility. Patient and his son agree.  UTI with Escherichia coli Patient reported dysuria and increased urinary urgency/frequency. UA suggests infection. Patient was started on ceftriaxone. Cultures are positive for Escherichia coli. Sensitivities reviewed. Changed to Vantin.   Hyponatremia  Serum sodium 129 on admission in the setting of dehydration. Given IV fluids. Improvement is noted. Will recommend repeating blood work next week..  Fall at home Does have some bruising in his back. Does not have any neurological deficits. Abdomen is completely benign. Hemoglobin is stable. Hemoglobin is stable. Some drop noted which is likely due to dilution from hydration. Seen by physical therapy. Short-term rehabilitation.  Chronic atrial fibrillation  Rate is well-controlled on admission. CHADS-VASc at least 5 (age x2, DM, HTN, CHF). Follows with cardiology; per cards notes, no anticoagulation d/t hx of anemia. Blood pressure noted to be somewhat on the lower side.   Chronic diastolic CHF  Dehydrated on admission. TTE (03/09/14) with EF 50-55%, severe LAE, mild MR, mod-severe TR, severely increased PA pressures. Managed with Toprol, and trandolapril at home. Unclear if he wasn't taking Lasix at home. This will need to be addressed in the outpatient setting. If he was on Lasix at home, would recommend restarting at a lower dose. Trandolapril was also held due to borderline low blood pressures. Resume depending on his pressures.  Normocytic anemia, thrombocytopenia Mild drop in hemoglobin is likely due to dilution. No overt bleeding noted. Anemia has been attributed to iron-deficiency and iron-supplementation will be continued. Platelet counts are stable.   Essential Hypertension Blood pressure borderline low. Appears to  be asymptomatic at this time.  dose of metoprolol was reduced. Other agents held as discussed above.    Type II DM  A1c was 6.9% in July 2015. Managed with metformin only at home.  Overall much improved. Patient is okay for discharge to skilled nursing facility.   PERTINENT LABS:  The results of significant diagnostics from this hospitalization (including imaging, microbiology, ancillary and laboratory) are listed below for reference.    Microbiology: Recent Results (from the past 240 hour(s))  Urine culture     Status: Abnormal   Collection Time: 08/04/16  2:08 PM  Result Value Ref Range Status   Specimen Description URINE, RANDOM  Final   Special Requests NONE  Final   Culture >=100,000 COLONIES/mL ESCHERICHIA COLI (A)  Final   Report Status 08/06/2016 FINAL  Final   Organism ID, Bacteria ESCHERICHIA COLI (A)  Final      Susceptibility   Escherichia coli - MIC*    AMPICILLIN <=2 SENSITIVE Sensitive     CEFAZOLIN <=4 SENSITIVE Sensitive     CEFTRIAXONE <=1 SENSITIVE Sensitive     CIPROFLOXACIN <=0.25 SENSITIVE Sensitive     GENTAMICIN <=1 SENSITIVE Sensitive     IMIPENEM <=0.25 SENSITIVE Sensitive     NITROFURANTOIN <=16 SENSITIVE Sensitive     TRIMETH/SULFA <=20 SENSITIVE Sensitive     AMPICILLIN/SULBACTAM <=2 SENSITIVE Sensitive     PIP/TAZO <=4 SENSITIVE Sensitive     Extended ESBL NEGATIVE Sensitive     * >=100,000 COLONIES/mL ESCHERICHIA COLI  MRSA PCR Screening     Status: None   Collection Time: 08/04/16  7:40 PM  Result Value Ref Range Status   MRSA by PCR NEGATIVE NEGATIVE Final    Comment:        The GeneXpert MRSA Assay (FDA approved for NASAL specimens only), is one component of a comprehensive MRSA colonization surveillance program. It is not intended to diagnose MRSA infection nor to guide or monitor treatment for MRSA infections.   Gastrointestinal Panel by PCR , Stool     Status: None   Collection Time: 08/05/16  4:42 AM  Result Value Ref Range Status   Campylobacter species NOT DETECTED NOT DETECTED Final   Plesimonas shigelloides NOT  DETECTED NOT DETECTED Final   Salmonella species NOT DETECTED NOT DETECTED Final   Yersinia enterocolitica NOT DETECTED NOT DETECTED Final   Vibrio species NOT DETECTED NOT DETECTED Final   Vibrio cholerae NOT DETECTED NOT DETECTED Final   Enteroaggregative E coli (EAEC) NOT DETECTED NOT DETECTED Final   Enteropathogenic E coli (EPEC) NOT DETECTED NOT DETECTED Final   Enterotoxigenic E coli (ETEC) NOT DETECTED NOT DETECTED Final   Shiga like toxin producing E coli (STEC) NOT DETECTED NOT DETECTED Final   Shigella/Enteroinvasive E coli (EIEC) NOT DETECTED NOT DETECTED Final   Cryptosporidium NOT DETECTED NOT DETECTED Final   Cyclospora cayetanensis NOT DETECTED NOT DETECTED Final   Entamoeba histolytica NOT DETECTED NOT DETECTED Final   Giardia lamblia NOT DETECTED NOT DETECTED Final   Adenovirus F40/41 NOT DETECTED NOT DETECTED Final   Astrovirus NOT DETECTED NOT DETECTED Final   Norovirus GI/GII NOT DETECTED NOT DETECTED Final   Rotavirus A NOT DETECTED NOT DETECTED Final   Sapovirus (I, II, IV, and V) NOT DETECTED NOT DETECTED Final  C difficile quick screen w PCR reflex     Status: None   Collection Time: 08/05/16  4:42 AM  Result Value Ref Range Status   C Diff antigen NEGATIVE NEGATIVE Final  C Diff toxin NEGATIVE NEGATIVE Final   C Diff interpretation No C. difficile detected.  Final     Labs: Basic Metabolic Panel:  Recent Labs Lab 08/04/16 1355 08/05/16 0445 08/06/16 0511 08/07/16 0459  NA 129* 132* 129* 131*  K 3.7 3.7 3.5 3.9  CL 93* 97* 98* 98*  CO2 27 27 24 25   GLUCOSE 138* 113* 114* 108*  BUN 38* 41* 33* 33*  CREATININE 1.19 1.15 0.93 0.89  CALCIUM 8.9 8.5* 8.2* 8.7*   Liver Function Tests:  Recent Labs Lab 08/04/16 1355 08/05/16 0445  AST 72* 49*  ALT 42 34  ALKPHOS 68 59  BILITOT 1.7* 1.2  PROT 6.3* 5.1*  ALBUMIN 3.6 2.7*   CBC:  Recent Labs Lab 08/04/16 1355 08/05/16 1421 08/06/16 0511 08/07/16 0459  WBC 11.2* 8.9 7.9 8.2  NEUTROABS  9.5*  --   --   --   HGB 11.4* 11.0* 10.3* 10.0*  HCT 33.2* 32.1* 30.1* 29.6*  MCV 98.8 97.6 97.4 98.0  PLT 101* 96* 111* 118*    CBG:  Recent Labs Lab 08/06/16 0820 08/06/16 1218 08/06/16 1646 08/06/16 2203 08/07/16 0754  GLUCAP 111* 126* 138* 111* 127*     IMAGING STUDIES No results found.  DISCHARGE EXAMINATION: Vitals:   08/06/16 1346 08/06/16 2205 08/07/16 0515 08/07/16 1024  BP: (!) 97/50 124/70 123/83 120/62  Pulse: 60 62 68 67  Resp: 15 18 18    Temp: 97.7 F (36.5 C) 98.2 F (36.8 C) 98.1 F (36.7 C)   TempSrc: Oral Oral Oral   SpO2: 99% 94% 93%   Weight:   77.6 kg (171 lb 1.2 oz)   Height:       General appearance: alert, cooperative, appears stated age and no distress Resp: clear to auscultation bilaterally Cardio: regular rate and rhythm, S1, S2 normal, no murmur, click, rub or gallop GI: soft, non-tender; bowel sounds normal; no masses,  no organomegaly Extremities: edema Mild  DISPOSITION: SNF  Discharge Instructions    Call MD for:  difficulty breathing, headache or visual disturbances    Complete by:  As directed    Call MD for:  extreme fatigue    Complete by:  As directed    Call MD for:  persistant dizziness or light-headedness    Complete by:  As directed    Call MD for:  persistant nausea and vomiting    Complete by:  As directed    Call MD for:  severe uncontrolled pain    Complete by:  As directed    Call MD for:  temperature >100.4    Complete by:  As directed    Diet Carb Modified    Complete by:  As directed    Discharge instructions    Complete by:  As directed    Please see instructions mentioned on the discharge summary.  You were cared for by a hospitalist during your hospital stay. If you have any questions about your discharge medications or the care you received while you were in the hospital after you are discharged, you can call the unit and asked to speak with the hospitalist on call if the hospitalist that took care  of you is not available. Once you are discharged, your primary care physician will handle any further medical issues. Please note that NO REFILLS for any discharge medications will be authorized once you are discharged, as it is imperative that you return to your primary care physician (or establish a relationship with  a primary care physician if you do not have one) for your aftercare needs so that they can reassess your need for medications and monitor your lab values. If you do not have a primary care physician, you can call [phone] for a physician referral.   Increase activity slowly    Complete by:  As directed       ALLERGIES: No Known Allergies   Current Discharge Medication List    START taking these medications   Details  acetaminophen (TYLENOL) 325 MG tablet Take 2 tablets (650 mg total) by mouth every 6 (six) hours as needed for mild pain (or Fever >/= 101).    cefpodoxime (VANTIN) 200 MG tablet Take 1 tablet (200 mg total) by mouth every 12 (twelve) hours. Qty: 10 tablet, Refills: 0      CONTINUE these medications which have CHANGED   Details  metoprolol succinate (TOPROL-XL) 25 MG 24 hr tablet Take 1 tablet (25 mg total) by mouth daily at 12 noon. Take with or immediately following a meal.      CONTINUE these medications which have NOT CHANGED   Details  cholestyramine (QUESTRAN) 4 g packet Take 4 g by mouth daily.    ferrous sulfate 325 (65 FE) MG tablet Take 1 tablet (325 mg total) by mouth 2 (two) times daily with a meal. Qty: 60 tablet, Refills: 3    metFORMIN (GLUCOPHAGE-XR) 500 MG 24 hr tablet Take 500 mg by mouth daily.     Multiple Vitamin (MULTIVITAMIN) LIQD Take 15 mLs by mouth daily at 12 noon. Centrum 9 mg iron/15 ml oral liquid    tamsulosin (FLOMAX) 0.4 MG CAPS capsule Take 0.4 mg by mouth at bedtime.     triamcinolone cream (KENALOG) 0.1 % Apply 1 application topically 2 (two) times daily as needed (for itching / irritation).       STOP taking  these medications     trandolapril (MAVIK) 2 MG tablet      furosemide (LASIX) 80 MG tablet      Potassium Chloride ER 20 MEQ TBCR      Multiple Vitamin (MULTIVITAMIN WITH MINERALS) TABS tablet          Follow-up Information    WILLETT,RALPH, MD. Schedule an appointment as soon as possible for a visit in 1 week(s).   Specialty:  Internal Medicine          TOTAL DISCHARGE TIME: 35 mins  [facility] Pager [phone]  08/07/2016, 11:29 AM

## 2016-09-12 ENCOUNTER — Ambulatory Visit (INDEPENDENT_AMBULATORY_CARE_PROVIDER_SITE_OTHER): Payer: Medicare Other | Admitting: *Deleted

## 2016-09-12 ENCOUNTER — Telehealth: Payer: Self-pay | Admitting: Cardiology

## 2016-09-12 DIAGNOSIS — I495 Sick sinus syndrome: Secondary | ICD-10-CM

## 2016-09-12 NOTE — Progress Notes (Signed)
Remote pacemaker transmission.   

## 2016-09-12 NOTE — Telephone Encounter (Signed)
Confirmed remote transmission w/ pt wife.   

## 2016-09-13 ENCOUNTER — Encounter: Payer: Self-pay | Admitting: Cardiology

## 2016-09-13 LAB — CUP PACEART REMOTE DEVICE CHECK
Battery Remaining Longevity: 100 mo
Date Time Interrogation Session: 20180110194412
Implantable Lead Location: 753860
Implantable Pulse Generator Implant Date: 20161110
Lead Channel Impedance Value: 0 Ohm
Lead Channel Pacing Threshold Amplitude: 0.75 V
Lead Channel Pacing Threshold Pulse Width: 0.4 ms
Lead Channel Setting Pacing Amplitude: 2.5 V
MDC IDC LEAD IMPLANT DT: 20070301
MDC IDC MSMT BATTERY IMPEDANCE: 204 Ohm
MDC IDC MSMT BATTERY VOLTAGE: 2.76 V
MDC IDC MSMT LEADCHNL RV IMPEDANCE VALUE: 436 Ohm
MDC IDC SET LEADCHNL RV PACING PULSEWIDTH: 0.4 ms
MDC IDC SET LEADCHNL RV SENSING SENSITIVITY: 2 mV
MDC IDC STAT BRADY RV PERCENT PACED: 81 %

## 2016-10-05 ENCOUNTER — Inpatient Hospital Stay (HOSPITAL_BASED_OUTPATIENT_CLINIC_OR_DEPARTMENT_OTHER)
Admission: EM | Admit: 2016-10-05 | Discharge: 2016-10-09 | DRG: 291 | Disposition: A | Discharge status: Skilled Nursing Facility | Payer: Medicare Other | Attending: Nephrology | Admitting: Nephrology

## 2016-10-05 ENCOUNTER — Emergency Department (HOSPITAL_BASED_OUTPATIENT_CLINIC_OR_DEPARTMENT_OTHER): Payer: Medicare Other

## 2016-10-05 ENCOUNTER — Encounter (HOSPITAL_BASED_OUTPATIENT_CLINIC_OR_DEPARTMENT_OTHER): Payer: Self-pay

## 2016-10-05 DIAGNOSIS — F0280 Dementia in other diseases classified elsewhere without behavioral disturbance: Secondary | ICD-10-CM

## 2016-10-05 DIAGNOSIS — J189 Pneumonia, unspecified organism: Secondary | ICD-10-CM | POA: Diagnosis present

## 2016-10-05 DIAGNOSIS — I481 Persistent atrial fibrillation: Secondary | ICD-10-CM | POA: Diagnosis present

## 2016-10-05 DIAGNOSIS — Z833 Family history of diabetes mellitus: Secondary | ICD-10-CM

## 2016-10-05 DIAGNOSIS — E119 Type 2 diabetes mellitus without complications: Secondary | ICD-10-CM

## 2016-10-05 DIAGNOSIS — I130 Hypertensive heart and chronic kidney disease with heart failure and stage 1 through stage 4 chronic kidney disease, or unspecified chronic kidney disease: Secondary | ICD-10-CM | POA: Diagnosis not present

## 2016-10-05 DIAGNOSIS — D649 Anemia, unspecified: Secondary | ICD-10-CM | POA: Diagnosis present

## 2016-10-05 DIAGNOSIS — N400 Enlarged prostate without lower urinary tract symptoms: Secondary | ICD-10-CM | POA: Diagnosis present

## 2016-10-05 DIAGNOSIS — Z7984 Long term (current) use of oral hypoglycemic drugs: Secondary | ICD-10-CM | POA: Diagnosis not present

## 2016-10-05 DIAGNOSIS — E1122 Type 2 diabetes mellitus with diabetic chronic kidney disease: Secondary | ICD-10-CM | POA: Diagnosis present

## 2016-10-05 DIAGNOSIS — M7989 Other specified soft tissue disorders: Secondary | ICD-10-CM | POA: Diagnosis not present

## 2016-10-05 DIAGNOSIS — I4891 Unspecified atrial fibrillation: Secondary | ICD-10-CM | POA: Diagnosis present

## 2016-10-05 DIAGNOSIS — Z66 Do not resuscitate: Secondary | ICD-10-CM | POA: Diagnosis present

## 2016-10-05 DIAGNOSIS — N179 Acute kidney failure, unspecified: Secondary | ICD-10-CM | POA: Diagnosis present

## 2016-10-05 DIAGNOSIS — N189 Chronic kidney disease, unspecified: Secondary | ICD-10-CM | POA: Diagnosis not present

## 2016-10-05 DIAGNOSIS — I482 Chronic atrial fibrillation: Secondary | ICD-10-CM

## 2016-10-05 DIAGNOSIS — E7800 Pure hypercholesterolemia, unspecified: Secondary | ICD-10-CM | POA: Diagnosis present

## 2016-10-05 DIAGNOSIS — E785 Hyperlipidemia, unspecified: Secondary | ICD-10-CM | POA: Diagnosis present

## 2016-10-05 DIAGNOSIS — G3183 Dementia with Lewy bodies: Secondary | ICD-10-CM | POA: Diagnosis not present

## 2016-10-05 DIAGNOSIS — I5032 Chronic diastolic (congestive) heart failure: Secondary | ICD-10-CM | POA: Diagnosis present

## 2016-10-05 DIAGNOSIS — Z515 Encounter for palliative care: Secondary | ICD-10-CM | POA: Diagnosis not present

## 2016-10-05 DIAGNOSIS — N183 Chronic kidney disease, stage 3 (moderate): Secondary | ICD-10-CM | POA: Diagnosis present

## 2016-10-05 DIAGNOSIS — Z87891 Personal history of nicotine dependence: Secondary | ICD-10-CM | POA: Diagnosis not present

## 2016-10-05 DIAGNOSIS — Z950 Presence of cardiac pacemaker: Secondary | ICD-10-CM

## 2016-10-05 DIAGNOSIS — I872 Venous insufficiency (chronic) (peripheral): Secondary | ICD-10-CM | POA: Diagnosis present

## 2016-10-05 DIAGNOSIS — I509 Heart failure, unspecified: Secondary | ICD-10-CM | POA: Diagnosis not present

## 2016-10-05 DIAGNOSIS — F0390 Unspecified dementia without behavioral disturbance: Secondary | ICD-10-CM | POA: Diagnosis present

## 2016-10-05 LAB — COMPREHENSIVE METABOLIC PANEL
ALBUMIN: 3.5 g/dL (ref 3.5–5.0)
ALT: 33 U/L (ref 17–63)
AST: 55 U/L — ABNORMAL HIGH (ref 15–41)
Alkaline Phosphatase: 98 U/L (ref 38–126)
Anion gap: 11 (ref 5–15)
BUN: 24 mg/dL — ABNORMAL HIGH (ref 6–20)
CO2: 30 mmol/L (ref 22–32)
Calcium: 9 mg/dL (ref 8.9–10.3)
Chloride: 96 mmol/L — ABNORMAL LOW (ref 101–111)
Creatinine, Ser: 1.18 mg/dL (ref 0.61–1.24)
GFR calc non Af Amer: 52 mL/min — ABNORMAL LOW (ref >60)
GLUCOSE: 95 mg/dL (ref 65–99)
POTASSIUM: 3.7 mmol/L (ref 3.5–5.1)
SODIUM: 137 mmol/L (ref 135–145)
Total Bilirubin: 2 mg/dL — ABNORMAL HIGH (ref 0.3–1.2)
Total Protein: 6.1 g/dL — ABNORMAL LOW (ref 6.5–8.1)

## 2016-10-05 LAB — CBC WITH DIFFERENTIAL/PLATELET
BASOS ABS: 0 10*3/uL (ref 0.0–0.1)
BASOS PCT: 0 %
Eosinophils Absolute: 0.1 10*3/uL (ref 0.0–0.7)
Eosinophils Relative: 1 %
HEMATOCRIT: 34.2 % — AB (ref 39.0–52.0)
HEMOGLOBIN: 11.1 g/dL — AB (ref 13.0–17.0)
LYMPHS PCT: 8 %
Lymphs Abs: 0.5 10*3/uL — ABNORMAL LOW (ref 0.7–4.0)
MCH: 32.6 pg (ref 26.0–34.0)
MCHC: 32.5 g/dL (ref 30.0–36.0)
MCV: 100.3 fL — AB (ref 78.0–100.0)
MONOS PCT: 11 %
Monocytes Absolute: 0.7 10*3/uL (ref 0.1–1.0)
NEUTROS ABS: 5.2 10*3/uL (ref 1.7–7.7)
NEUTROS PCT: 81 %
Platelets: 91 10*3/uL — ABNORMAL LOW (ref 150–400)
RBC: 3.41 MIL/uL — ABNORMAL LOW (ref 4.22–5.81)
RDW: 13.8 % (ref 11.5–15.5)
WBC: 6.4 10*3/uL (ref 4.0–10.5)

## 2016-10-05 LAB — TROPONIN I: Troponin I: <0.03 ng/mL (ref <0.03)

## 2016-10-05 LAB — BRAIN NATRIURETIC PEPTIDE: B Natriuretic Peptide: 130.6 pg/mL — ABNORMAL HIGH (ref 0.0–100.0)

## 2016-10-05 MED ORDER — FUROSEMIDE 10 MG/ML IJ SOLN
40.0000 mg | Freq: Two times a day (BID) | INTRAMUSCULAR | Status: DC
  Administered 2016-10-06 – 2016-10-07 (×3): 40 mg via INTRAVENOUS
  Filled 2016-10-05 (×3): qty 4

## 2016-10-05 MED ORDER — ACETAMINOPHEN 325 MG PO TABS: 650.0000 mg | ORAL_TABLET | Freq: Four times a day (QID) | ORAL | Status: DC | PRN

## 2016-10-05 MED ORDER — ADULT MULTIVITAMIN LIQUID CH
15.0000 mL | Freq: Every day | ORAL | Status: DC
  Administered 2016-10-06 – 2016-10-09 (×4): 15 mL via ORAL
  Filled 2016-10-05 (×4): qty 15

## 2016-10-05 MED ORDER — FERROUS SULFATE 325 (65 FE) MG PO TABS
325.0000 mg | ORAL_TABLET | Freq: Two times a day (BID) | ORAL | Status: DC
  Administered 2016-10-06 – 2016-10-09 (×7): 325 mg via ORAL
  Filled 2016-10-05 (×7): qty 1

## 2016-10-05 MED ORDER — AZITHROMYCIN 250 MG PO TABS
500.0000 mg | ORAL_TABLET | ORAL | Status: DC
  Administered 2016-10-06 – 2016-10-08 (×3): 500 mg via ORAL
  Filled 2016-10-05 (×3): qty 2

## 2016-10-05 MED ORDER — METFORMIN HCL ER 500 MG PO TB24
500.0000 mg | ORAL_TABLET | Freq: Every day | ORAL | Status: DC
Start: 2016-10-06 — End: 2016-10-06
  Administered 2016-10-06: 500 mg via ORAL
  Filled 2016-10-05: qty 1

## 2016-10-05 MED ORDER — METOPROLOL SUCCINATE ER 25 MG PO TB24
25.0000 mg | ORAL_TABLET | Freq: Every day | ORAL | Status: DC
  Administered 2016-10-06 – 2016-10-09 (×3): 25 mg via ORAL
  Filled 2016-10-05 (×4): qty 1

## 2016-10-05 MED ORDER — DEXTROSE 5 % IV SOLN
500.0000 mg | Freq: Once | INTRAVENOUS | Status: AC
  Administered 2016-10-05: 500 mg via INTRAVENOUS

## 2016-10-05 MED ORDER — CEFTRIAXONE SODIUM 1 G IJ SOLR
1.0000 g | INTRAMUSCULAR | Status: DC
  Administered 2016-10-06 – 2016-10-08 (×3): 1 g via INTRAVENOUS
  Filled 2016-10-05 (×4): qty 10

## 2016-10-05 MED ORDER — TAMSULOSIN HCL 0.4 MG PO CAPS
0.4000 mg | ORAL_CAPSULE | Freq: Every day | ORAL | Status: DC
  Administered 2016-10-06 – 2016-10-08 (×4): 0.4 mg via ORAL
  Filled 2016-10-05 (×4): qty 1

## 2016-10-05 MED ORDER — FUROSEMIDE 10 MG/ML IJ SOLN
40.0000 mg | Freq: Once | INTRAMUSCULAR | Status: AC
  Administered 2016-10-05: 40 mg via INTRAVENOUS
  Filled 2016-10-05: qty 4

## 2016-10-05 MED ORDER — AZITHROMYCIN 500 MG IV SOLR
INTRAVENOUS | Status: AC
  Filled 2016-10-05: qty 500

## 2016-10-05 MED ORDER — ENOXAPARIN SODIUM 40 MG/0.4ML ~~LOC~~ SOLN
40.0000 mg | SUBCUTANEOUS | Status: DC
  Administered 2016-10-06 – 2016-10-09 (×4): 40 mg via SUBCUTANEOUS
  Filled 2016-10-05 (×4): qty 0.4

## 2016-10-05 MED ORDER — CEFTRIAXONE SODIUM 1 G IJ SOLR
1.0000 g | Freq: Once | INTRAMUSCULAR | Status: AC
  Administered 2016-10-05: 1 g via INTRAVENOUS
  Filled 2016-10-05: qty 10

## 2016-10-05 NOTE — ED Notes (Signed)
Pt incontinent of urine; cleaned and changed by EMTS.

## 2016-10-05 NOTE — ED Notes (Signed)
Pt ambulated a short distance using a walker. HR 84 SAT 94% on room air. Pt did have to stop several times to catch his breath

## 2016-10-05 NOTE — ED Triage Notes (Signed)
Per pt wife pt with bilat LE and UE swelling x 3 days-sent by PCP-presents to triage in w/c-NAD

## 2016-10-05 NOTE — ED Notes (Signed)
Patient transported to X-ray 

## 2016-10-05 NOTE — ED Notes (Signed)
Report to Gerrit Friends, RN on 6N at [facility].

## 2016-10-05 NOTE — Progress Notes (Signed)
Transfer from [facility] [name] [name] is a 83 year old male with pmh HTN, HLD, CHF last EF 50-55% and 03/2014, afib; who presents with progressive worsening SOB and cough. + weight gain of 20 pounds in the last month. O2 sats maintained on RA. Labs reveal WBC 6.4, hemoglobin 11.1, platelets 91, BNP 130.6. CXRshowing right basilar opacity with pleural effusion. Patient found to have pedal edema on physical exam given 40 mg Lasix IV. Patient started on empiric antibiotics of Rocephin and azithromycin. Transferring to a MedSurg bed inpatient status.

## 2016-10-05 NOTE — ED Provider Notes (Signed)
[facility] DEPT [facility] Provider Note   CSN: [id number] Arrival date & time: 10/05/16  1524     History   Chief Complaint Chief Complaint  Patient presents with  . Leg Swelling    HPI Yohaan Theile is a 83 y.o. male.  Patient is a 83 year old male with a history of A. fib, hypertension, hyperlipidemia, DM and congestive heart failure who presents with leg swelling and shortness of breath. His wife states he's gained about 20 pounds over the last month. He's had increase in his lower extremity edema, more markedly over the last 2-3 days with increase in his baseline shortness of breath. There is no fevers. No vomiting. He denies any chest pain. He was seen by his PCP this morning and sent over here for further evaluation. Of note, he recently was in a rehabilitation facility and they decrease his Lasix to 20 mg once a day. This happened a little over a month ago per his wife.      Past Medical History:  Diagnosis Date  . Acute on chronic diastolic heart failure ([place])   . Anemia, unspecified   . Arthritis    "generalized aches and pains"  . Asthma    "none since age 66"  . Atrial fibrillation ([place])   . Basal cell carcinoma    "head, arms, hands, legs"  . Benign localized hyperplasia of prostate with urinary obstruction and other lower urinary tract symptoms (LUTS)(600.21)   . Blood in stool    "went away spontaneously"  . Chest pain, unspecified   . CHF (congestive heart failure) ([place])   . Gouty arthropathy, unspecified   . Heart murmur   . High cholesterol   . History of blood transfusion ~ 11/2013; 03/2014  . Hypertension   . Memory loss   . Mitral valve disorders(424.0)   . Other and unspecified angina pectoris   . Pacemaker   . Phlebitis    "when he broke his hip"  . Slowing of urinary stream   . Squamous carcinoma    "head, arms, hands, legs"  . Type II diabetes mellitus ([place])   . Urinary obstruction, not elsewhere classified   . Varicose vein of leg   .  Varicose veins of lower extremities with inflammation     Patient Active Problem List   Diagnosis Date Noted  . CAP (community acquired pneumonia) 10/05/2016  . Hyponatremia 08/04/2016  . Dehydration 08/04/2016  . Diarrhea 08/04/2016  . Diabetes mellitus type II, non insulin dependent ([place]) 08/04/2016  . Hypertension 08/04/2016  . UTI (urinary tract infection) 08/04/2016  . Atrial fibrillation ([place]) 08/04/2016  . Normocytic anemia 08/04/2016  . Thrombocytopenia ([place]) 08/04/2016  . Tachy-brady syndrome ([place]) 07/14/2015  . Pacemaker 07/14/2015  . Chronic diastolic CHF (congestive heart failure) ([place])   . Dyspnea 03/31/2014  . Symptomatic anemia 03/31/2014    Past Surgical History:  Procedure Laterality Date  . CATARACT EXTRACTION W/ INTRAOCULAR LENS IMPLANT Bilateral   . EP IMPLANTABLE DEVICE N/A 07/14/2015   Procedure: PPM Generator Changeout;  Surgeon: Deboraha Sprang, MD;  Location: [facility];  Service: Cardiovascular;  Laterality: N/A;  . HIP FRACTURE SURGERY Right ~ 2005  . INSERT / REPLACE / REMOVE PACEMAKER  ~ 2005  . MOHS SURGERY     "?leg"  . SKIN CANCER EXCISION     "head, arms, hands, legs"  . TONSILLECTOMY         Home Medications    Prior to Admission medications  Medication Sig Start Date End Date Taking? Authorizing Provider  furosemide (LASIX) 20 MG tablet Take 20 mg by mouth daily.   Yes Historical Provider, MD  acetaminophen (TYLENOL) 325 MG tablet Take 2 tablets (650 mg total) by mouth every 6 (six) hours as needed for mild pain (or Fever >/= 101). 08/07/16   Bonnielee Haff, MD  ferrous sulfate 325 (65 FE) MG tablet Take 1 tablet (325 mg total) by mouth 2 (two) times daily with a meal. 04/03/14   Kelvin Cellar, MD  metFORMIN (GLUCOPHAGE-XR) 500 MG 24 hr tablet Take 500 mg by mouth daily.     Historical Provider, MD  metoprolol succinate (TOPROL-XL) 25 MG 24 hr tablet Take 1 tablet (25 mg total) by mouth daily at 12 noon. Take with or immediately  following a meal. 08/07/16   Bonnielee Haff, MD  Multiple Vitamin (MULTIVITAMIN) LIQD Take 15 mLs by mouth daily at 12 noon. Centrum 9 mg iron/15 ml oral liquid    Historical Provider, MD  tamsulosin (FLOMAX) 0.4 MG CAPS capsule Take 0.4 mg by mouth at bedtime.     Historical Provider, MD  triamcinolone cream (KENALOG) 0.1 % Apply 1 application topically 2 (two) times daily as needed (for itching / irritation).     Historical Provider, MD    Family History Family History  Problem Relation Age of Onset  . Other Mother     ARTERITIS  . Alzheimer's disease Father   . Pneumonia Father 95  . Cancer Brother     PANCREATIC  . Heart Problems Brother     AGE 70  . Diabetes Son     Social History Social History  Substance Use Topics  . Smoking status: Former Smoker    Packs/day: 3.00    Years: 20.00    Types: Cigarettes  . Smokeless tobacco: Never Used     Comment: "quit smoking in ~ 1970's"  . Alcohol use No     Allergies   Patient has no known allergies.   Review of Systems Review of Systems  Constitutional: Positive for fatigue. Negative for chills, diaphoresis and fever.  HENT: Negative for congestion, rhinorrhea and sneezing.   Eyes: Negative.   Respiratory: Positive for cough and shortness of breath. Negative for chest tightness.   Cardiovascular: Positive for leg swelling. Negative for chest pain.  Gastrointestinal: Negative for abdominal pain, blood in stool, diarrhea, nausea and vomiting.  Genitourinary: Negative for difficulty urinating, flank pain, frequency and hematuria.  Musculoskeletal: Negative for arthralgias and back pain.  Skin: Negative for rash.  Neurological: Negative for dizziness, speech difficulty, weakness, numbness and headaches.     Physical Exam Updated Vital Signs BP 133/73   Pulse 83   Temp 97.9 F (36.6 C) (Oral)   Resp 21   Ht 5\' 8"  (1.727 m)   Wt 189 lb (85.7 kg)   SpO2 93%   BMI 28.74 kg/m   Physical Exam  Constitutional: He is  oriented to person, place, and time. He appears well-developed and well-nourished.  HENT:  Head: Normocephalic and atraumatic.  Eyes: Pupils are equal, round, and reactive to light.  Neck: Normal range of motion. Neck supple.  Cardiovascular: Normal rate and regular rhythm.   Murmur heard. Pulmonary/Chest: Effort normal. No respiratory distress. He has no wheezes. He has rales. He exhibits no tenderness.  Abdominal: Soft. Bowel sounds are normal. There is no tenderness. There is no rebound and no guarding.  Musculoskeletal: Normal range of motion. He exhibits edema.  2+ pain  edema to the right lower extremity, 3+ pain edema to the left lower extremity with overlying skin scaliness. There some mild warmth and erythema. There is clear weeping of the left lower leg.  Lymphadenopathy:    He has no cervical adenopathy.  Neurological: He is alert and oriented to person, place, and time.  Skin: Skin is warm and dry. No rash noted.  Psychiatric: He has a normal mood and affect.     ED Treatments / Results  Labs (all labs ordered are listed, but only abnormal results are displayed) Labs Reviewed  COMPREHENSIVE METABOLIC PANEL - Abnormal; Notable for the following:       Result Value   Chloride 96 (*)    BUN 24 (*)    Total Protein 6.1 (*)    AST 55 (*)    Total Bilirubin 2.0 (*)    GFR calc non Af Amer 52 (*)    All other components within normal limits  CBC WITH DIFFERENTIAL/PLATELET - Abnormal; Notable for the following:    RBC 3.41 (*)    Hemoglobin 11.1 (*)    HCT 34.2 (*)    MCV 100.3 (*)    Platelets 91 (*)    Lymphs Abs 0.5 (*)    All other components within normal limits  BRAIN NATRIURETIC PEPTIDE - Abnormal; Notable for the following:    B Natriuretic Peptide 130.6 (*)    All other components within normal limits  TROPONIN I    EKG  EKG Interpretation  Date/Time:  Friday October 05 2016 17:57:52 EST Ventricular Rate:  83 PR Interval:    QRS Duration: 117 QT  Interval:  528 QTC Calculation: 621 R Axis:   94 Text Interpretation:  Atrial fibrillation Incomplete right bundle branch block Borderline low voltage, extremity leads Minimal ST elevation, lateral leads since last tracing no significant change Confirmed by Kassady Laboy  MD, Dakai Braithwaite ([id number]) on 10/05/2016 6:09:35 PM       Radiology Dg Chest 2 View  Result Date: 10/05/2016 CLINICAL DATA:  Cough, shortness of breath. EXAM: CHEST  2 VIEW COMPARISON:  Radiographs of March 31, 2014. FINDINGS: Stable cardiomegaly. Atherosclerosis of thoracic aorta is noted. Single lead left-sided pacemaker is unchanged in position. No pneumothorax is noted. Stable left basilar scarring is noted. Increased right basilar opacity is noted concerning for worsening atelectasis or infiltrate with associated pleural effusion. Bony thorax is unremarkable. IMPRESSION: Aortic atherosclerosis. Increased right basilar opacity is noted concerning for worsening pneumonia or atelectasis with associated pleural effusion. Electronically Signed   By: Marijo Conception, M.D.   On: 10/05/2016 16:42   US Venous Img Lower Unilateral Left  Result Date: 10/05/2016 CLINICAL DATA:  Left lower extremity edema. EXAM: LEFT LOWER EXTREMITY VENOUS DOPPLER ULTRASOUND TECHNIQUE: Gray-scale sonography with graded compression, as well as color Doppler and duplex ultrasound were performed to evaluate the lower extremity deep venous systems from the level of the common femoral vein and including the common femoral, femoral, profunda femoral, popliteal and calf veins including the posterior tibial, peroneal and gastrocnemius veins when visible. The superficial great saphenous vein was also interrogated. Spectral Doppler was utilized to evaluate flow at rest and with distal augmentation maneuvers in the common femoral, femoral and popliteal veins. COMPARISON:  None. FINDINGS: Contralateral Common Femoral Vein: Respiratory phasicity is normal and symmetric with the symptomatic  side. No evidence of thrombus. Normal compressibility. Common Femoral Vein: No evidence of thrombus. Normal compressibility, respiratory phasicity and response to augmentation. Saphenofemoral Junction: No evidence of thrombus.  Normal compressibility and flow on color Doppler imaging. Profunda Femoral Vein: No evidence of thrombus. Normal compressibility and flow on color Doppler imaging. Femoral Vein: No evidence of thrombus. Normal compressibility, respiratory phasicity and response to augmentation. Popliteal Vein: No evidence of thrombus. Normal compressibility, respiratory phasicity and response to augmentation. Calf Veins: No evidence of thrombus. Normal compressibility and flow on color Doppler imaging. Superficial Great Saphenous Vein: No evidence of thrombus. Normal compressibility and flow on color Doppler imaging. Venous Reflux:  None. Other Findings: Well-circumscribed anechoic fluid collection in the popliteal fossa measures approximately 4.7 x 1.2 x 2.2 cm and is consistent with a Baker's cyst. IMPRESSION: No evidence of left lower extremity deep venous thrombosis. Popliteal fossa Baker's cyst identified. Electronically Signed   By: Aletta Edouard M.D.   On: 10/05/2016 17:31    Procedures Procedures (including critical care time)  Medications Ordered in ED Medications  azithromycin (ZITHROMAX) 500 MG injection (not administered)  furosemide (LASIX) injection 40 mg (40 mg Intravenous Given 10/05/16 1750)  cefTRIAXone (ROCEPHIN) 1 g in dextrose 5 % 50 mL IVPB (0 g Intravenous Stopped 10/05/16 1829)  azithromycin (ZITHROMAX) 500 mg in dextrose 5 % 250 mL IVPB (0 mg Intravenous Stopped 10/05/16 1945)     Initial Impression / Assessment and Plan / ED Course  I have reviewed the triage vital signs and the nursing notes.  Pertinent labs & imaging results that were available during my care of the patient were reviewed by me and considered in my medical decision making (see chart for details).      Patient with a history of CHF presents with lower extremity edema, weight gain and increased shortness of breath. His EKG doesn't show any ischemic changes. He is in atrial fibrillation which she does have a history of. He is not on anticoagulants due to prior anemia. He was diuresed in the ED with some urine output. His chest x-ray doesn't show evidence of fluid overload although he does have evidence of pneumonia, likely community-acquired. He lives in a nursing facility but in the independent living area. He does ambulate with an oxygen saturation of 94% but he does get tachypnea and short of breath with ambulation. He has a pneumonia severity index of 100 which is moderate risk for complications. I will consult the hospitalist for admission. He was given Rocephin and Zithromax. He does not have clinical markers for sepsis.  I spoke with Dr. Tamala Julian who accepts pt to [facility] to med/surg, inpatient status.  Final Clinical Impressions(s) / ED Diagnoses   Final diagnoses:  Community acquired pneumonia, unspecified laterality    New Prescriptions New Prescriptions   No medications on file     Malvin Johns, MD 10/05/16 1952

## 2016-10-05 NOTE — ED Notes (Signed)
Patient transported to Ultrasound 

## 2016-10-05 NOTE — ED Notes (Signed)
[place] NUMBER [phone]

## 2016-10-06 LAB — GLUCOSE, CAPILLARY
Glucose-Capillary: 123 mg/dL — ABNORMAL HIGH (ref 65–99)
Glucose-Capillary: 96 mg/dL (ref 65–99)

## 2016-10-06 LAB — URINALYSIS, ROUTINE W REFLEX MICROSCOPIC
BILIRUBIN URINE: NEGATIVE
Glucose, UA: NEGATIVE mg/dL
HGB URINE DIPSTICK: NEGATIVE
Ketones, ur: NEGATIVE mg/dL
Nitrite: NEGATIVE
PH: 5 (ref 5.0–8.0)
Protein, ur: NEGATIVE mg/dL
SPECIFIC GRAVITY, URINE: 1.02 (ref 1.005–1.030)

## 2016-10-06 LAB — CBC
HCT: 33.8 % — ABNORMAL LOW (ref 39.0–52.0)
Hemoglobin: 10.9 g/dL — ABNORMAL LOW (ref 13.0–17.0)
MCH: 32.1 pg (ref 26.0–34.0)
MCHC: 32.2 g/dL (ref 30.0–36.0)
MCV: 99.4 fL (ref 78.0–100.0)
PLATELETS: 100 10*3/uL — AB (ref 150–400)
RBC: 3.4 MIL/uL — ABNORMAL LOW (ref 4.22–5.81)
RDW: 14.2 % (ref 11.5–15.5)
WBC: 8.2 10*3/uL (ref 4.0–10.5)

## 2016-10-06 LAB — HIV ANTIBODY (ROUTINE TESTING W REFLEX): HIV Screen 4th Generation wRfx: NONREACTIVE

## 2016-10-06 LAB — STREP PNEUMONIAE URINARY ANTIGEN: Strep Pneumo Urinary Antigen: NEGATIVE

## 2016-10-06 LAB — URINALYSIS, MICROSCOPIC (REFLEX)

## 2016-10-06 LAB — MRSA PCR SCREENING: MRSA BY PCR: NEGATIVE

## 2016-10-06 LAB — CREATININE, SERUM
Creatinine, Ser: 1.29 mg/dL — ABNORMAL HIGH (ref 0.61–1.24)
GFR calc non Af Amer: 47 mL/min — ABNORMAL LOW (ref >60)
GFR, EST AFRICAN AMERICAN: 55 mL/min — AB (ref >60)

## 2016-10-06 MED ORDER — FUROSEMIDE 40 MG PO TABS: 40.0000 mg | ORAL_TABLET | Freq: Every day | ORAL | Status: DC

## 2016-10-06 MED ORDER — INSULIN ASPART 100 UNIT/ML ~~LOC~~ SOLN
0.0000 [IU] | Freq: Three times a day (TID) | SUBCUTANEOUS | Status: DC
  Administered 2016-10-06: 1 [IU] via SUBCUTANEOUS

## 2016-10-06 NOTE — Progress Notes (Signed)
Son helped patient up to [facility]. Called RN to report a trickle of blood on both calfs of legs. After cleaning each leg, foam dressings were applied to 3 small openings, 2 on ritht calf and one on left.

## 2016-10-06 NOTE — H&P (Signed)
History and Physical    [name] [name] [id number] DOB: 08/06/1924 DOA: 10/05/2016  PCP: Arlyss Repress, MD  Patient coming from: Home  Chief Complaint: Swelling, SOB  HPI: [name] [name] is a 83 y.o. male with medical history significant of DM2, HTN, mild dementia, mild CHF with EF of 50-55%, Afib, arthritis who presented with swelling, weight gain and SOB.  The patient denied these things to me and then corrected himself that he was having those symptoms.  He has gained about 20# over a month and presented to his PCP with swelling and weeping from the skin.  I got most of the history from chart review and confirmed with wife over the phone.  Apparently, the patient was in a rehab recently and his lasix dose was decreased to 20mg  daily with another PRN for weight gain.  He was previously taking 60mg  of lasix daily, but then was admitted to the hospital for dehydration and lasix was stopped and then restarted at the current dose.  His wife attempted to give him BID lasix on the day prior to admission, but it did not seem to help.  The patient is SOB with lying flat.  He has no fevers, chills, vomiting, lightheadedness, chest pain per him.  Overall it seems that [name] [name] has slowly been gaining weight on a low lasix dose at home.  He is having a mild cough and might have a pneumonia based on CXR findings.    ED Course: Found to be volume overloaded, CXR with findings concerning for pneumonia, saturating well but with tachypnea.  Given IV lasix X 1, rocephin and azithromycin and sent to [facility] for admission.  Cr noted to be 1.18, slightly worse than prior in the system, but likely within baseline.  BNP 130.  TnI 0.03.  [place] left lower extremity negative for DVT. EKG with low voltage and Afib.   Review of Systems: As per HPI otherwise 10 point review of systems negative.   Past Medical History:  Diagnosis Date  . Acute on chronic diastolic heart failure ([place])   . Anemia, unspecified   . Arthritis    "generalized aches and pains"  . Asthma    "none since age 51"  . Atrial fibrillation ([place])   . Basal cell carcinoma    "head, arms, hands, legs"  . Benign localized hyperplasia of prostate with urinary obstruction and other lower urinary tract symptoms (LUTS)(600.21)   . Blood in stool    "went away spontaneously"  . Chest pain, unspecified   . CHF (congestive heart failure) ([place])   . Gouty arthropathy, unspecified   . Heart murmur   . High cholesterol   . History of blood transfusion ~ 11/2013; 03/2014  . Hypertension   . Memory loss   . Mitral valve disorders(424.0)   . Other and unspecified angina pectoris   . Pacemaker   . Phlebitis    "when he broke his hip"  . Slowing of urinary stream   . Squamous carcinoma    "head, arms, hands, legs"  . Type II diabetes mellitus ([place])   . Urinary obstruction, not elsewhere classified   . Varicose vein of leg   . Varicose veins of lower extremities with inflammation     Past Surgical History:  Procedure Laterality Date  . CATARACT EXTRACTION W/ INTRAOCULAR LENS IMPLANT Bilateral   . EP IMPLANTABLE DEVICE N/A 07/14/2015   Procedure: PPM Generator Changeout;  Surgeon: Deboraha Sprang, MD;  Location: [facility];  Service:  Cardiovascular;  Laterality: N/A;  . HIP FRACTURE SURGERY Right ~ 2005  . INSERT / REPLACE / REMOVE PACEMAKER  ~ 2005  . MOHS SURGERY     "?leg"  . SKIN CANCER EXCISION     "head, arms, hands, legs"  . TONSILLECTOMY       reports that he has quit smoking. His smoking use included Cigarettes. He has a 60.00 pack-year smoking history. He has never used smokeless tobacco. He reports that he does not drink alcohol or use drugs.  No Known Allergies  Family History  Problem Relation Age of Onset  . Other Mother     ARTERITIS  . Alzheimer's disease Father   . Pneumonia Father 95  . Cancer Brother     PANCREATIC  . Heart Problems Brother     AGE 66  . Diabetes Son     Prior to Admission medications     Medication Sig Start Date End Date Taking? Authorizing Provider  furosemide (LASIX) 20 MG tablet Take 20 mg by mouth daily.   Yes Historical Provider, MD  acetaminophen (TYLENOL) 325 MG tablet Take 2 tablets (650 mg total) by mouth every 6 (six) hours as needed for mild pain (or Fever >/= 101). 08/07/16   Bonnielee Haff, MD  ferrous sulfate 325 (65 FE) MG tablet Take 1 tablet (325 mg total) by mouth 2 (two) times daily with a meal. 04/03/14   Kelvin Cellar, MD  metFORMIN (GLUCOPHAGE-XR) 500 MG 24 hr tablet Take 500 mg by mouth daily.     Historical Provider, MD  metoprolol succinate (TOPROL-XL) 25 MG 24 hr tablet Take 1 tablet (25 mg total) by mouth daily at 12 noon. Take with or immediately following a meal. 08/07/16   Bonnielee Haff, MD  Multiple Vitamin (MULTIVITAMIN) LIQD Take 15 mLs by mouth daily at 12 noon. Centrum 9 mg iron/15 ml oral liquid    Historical Provider, MD  tamsulosin (FLOMAX) 0.4 MG CAPS capsule Take 0.4 mg by mouth at bedtime.     Historical Provider, MD  triamcinolone cream (KENALOG) 0.1 % Apply 1 application topically 2 (two) times daily as needed (for itching / irritation).     Historical Provider, MD    Physical Exam: Vitals:   10/05/16 1945 10/05/16 2000 10/05/16 2030 10/05/16 2220  BP:  118/74 126/63 132/66  Pulse: 83 83 83 93  Resp: (!) 27 21 21 20   Temp:    97.5 F (36.4 C)  TempSrc:    Oral  SpO2: 97% 95% 96% 90%  Weight:    188 lb 1.6 oz (85.3 kg)  Height:    5\' 7"  (1.702 m)    Constitutional: Lying in bed, somewhat confused and anxious Vitals:   10/05/16 1945 10/05/16 2000 10/05/16 2030 10/05/16 2220  BP:  118/74 126/63 132/66  Pulse: 83 83 83 93  Resp: (!) 27 21 21 20   Temp:    97.5 F (36.4 C)  TempSrc:    Oral  SpO2: 97% 95% 96% 90%  Weight:    188 lb 1.6 oz (85.3 kg)  Height:    5\' 7"  (1.702 m)   ENMT: Mucous membranes are moist. Posterior pharynx clear of any exudate or lesions. Respiratory: Decreased breath sounds right based, crackles in  left base, no wheezing.  Tachypneic.    Cardiovascular: Irreg Irreg rhythm, normal rate, no murmurs / rubs / gallops. 2+ pitting on the right, 3 + pitting on the left, DP pulses are able to be palpated.  JVD  to chin Abdomen: no tenderness, or distention. Bowel sounds positive.  Musculoskeletal:  No joint deformity upper and lower extremities.  no contractures. Normal muscle tone.  Skin: Changes of chronic venous insufficiency in the lower extremities bilaterally, very thick skin, reddish discoloration.  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   Neurologic: Very difficult to assess, he is moving in bed and speaking to me, but confused.  Oriented to person onlly.   Psychiatric: Anxious but conversant with me.    Labs on Admission: I have personally reviewed following labs and imaging studies  CBC:  Recent Labs Lab 10/05/16 1620 10/06/16 0000  WBC 6.4 8.2  NEUTROABS 5.2  --   HGB 11.1* 10.9*  HCT 34.2* 33.8*  MCV 100.3* 99.4  PLT 91* PENDING   Basic Metabolic Panel:  Recent Labs Lab 10/05/16 1620 10/06/16 0000  NA 137  --   K 3.7  --   CL 96*  --   CO2 30  --   GLUCOSE 95  --   BUN 24*  --   CREATININE 1.18 1.29*  CALCIUM 9.0  --    GFR: Estimated Creatinine Clearance: 39.7 mL/min (by C-G formula based on SCr of 1.29 mg/dL (H)). Liver Function Tests:  Recent Labs Lab 10/05/16 1620  AST 55*  ALT 33  ALKPHOS 98  BILITOT 2.0*  PROT 6.1*  ALBUMIN 3.5   No results for input(s): LIPASE, AMYLASE in the last 168 hours. No results for input(s): AMMONIA in the last 168 hours. Coagulation Profile: No results for input(s): INR, PROTIME in the last 168 hours. Cardiac Enzymes:  Recent Labs Lab 10/05/16 1620  TROPONINI <0.03   BNP (last 3 results) No results for input(s): PROBNP in the last 8760 hours. HbA1C: No results for input(s): HGBA1C in the last 72 hours. CBG: No results for input(s):  GLUCAP in the last 168 hours. Lipid Profile: No results for input(s): CHOL, HDL, LDLCALC, TRIG, CHOLHDL, LDLDIRECT in the last 72 hours. Thyroid Function Tests: No results for input(s): TSH, T4TOTAL, FREET4, T3FREE, THYROIDAB in the last 72 hours. Anemia Panel: No results for input(s): VITAMINB12, FOLATE, FERRITIN, TIBC, IRON, RETICCTPCT in the last 72 hours. Urine analysis:    Component Value Date/Time   COLORURINE AMBER (A) 08/04/2016 1408   APPEARANCEUR CLOUDY (A) 08/04/2016 1408   LABSPEC 1.018 08/04/2016 1408   PHURINE 5.5 08/04/2016 1408   GLUCOSEU NEGATIVE 08/04/2016 1408   HGBUR MODERATE (A) 08/04/2016 1408   BILIRUBINUR SMALL (A) 08/04/2016 1408   KETONESUR NEGATIVE 08/04/2016 1408   PROTEINUR 30 (A) 08/04/2016 1408   NITRITE NEGATIVE 08/04/2016 1408   LEUKOCYTESUR LARGE (A) 08/04/2016 1408   Sepsis Labs: !!!!!!!!!!!!!!!!!!!!!!!!!!!!!!!!!!!!!!!!!!!! @LABRCNTIP (procalcitonin:4,lacticidven:4) )No results found for this or any previous visit (from the past 240 hour(s)).   Radiological Exams on Admission: Dg Chest 2 View  Result Date: 10/05/2016 CLINICAL DATA:  Cough, shortness of breath. EXAM: CHEST  2 VIEW COMPARISON:  Radiographs of March 31, 2014. FINDINGS: Stable cardiomegaly. Atherosclerosis of thoracic aorta is noted. Single lead left-sided pacemaker is unchanged in position. No pneumothorax is noted. Stable left basilar scarring is noted. Increased right basilar opacity is noted concerning for worsening atelectasis or infiltrate with associated pleural effusion. Bony thorax is unremarkable. IMPRESSION: Aortic atherosclerosis. Increased right basilar opacity is noted concerning for worsening pneumonia or atelectasis with associated pleural effusion. Electronically Signed   By: Marijo Conception, M.D.   On: 10/05/2016 16:42  US Venous Img Lower Unilateral Left  Result Date: 10/05/2016 CLINICAL DATA:  Left lower extremity edema. EXAM: LEFT LOWER EXTREMITY VENOUS DOPPLER  ULTRASOUND TECHNIQUE: Gray-scale sonography with graded compression, as well as color Doppler and duplex ultrasound were performed to evaluate the lower extremity deep venous systems from the level of the common femoral vein and including the common femoral, femoral, profunda femoral, popliteal and calf veins including the posterior tibial, peroneal and gastrocnemius veins when visible. The superficial great saphenous vein was also interrogated. Spectral Doppler was utilized to evaluate flow at rest and with distal augmentation maneuvers in the common femoral, femoral and popliteal veins. COMPARISON:  None. FINDINGS: Contralateral Common Femoral Vein: Respiratory phasicity is normal and symmetric with the symptomatic side. No evidence of thrombus. Normal compressibility. Common Femoral Vein: No evidence of thrombus. Normal compressibility, respiratory phasicity and response to augmentation. Saphenofemoral Junction: No evidence of thrombus. Normal compressibility and flow on color Doppler imaging. Profunda Femoral Vein: No evidence of thrombus. Normal compressibility and flow on color Doppler imaging. Femoral Vein: No evidence of thrombus. Normal compressibility, respiratory phasicity and response to augmentation. Popliteal Vein: No evidence of thrombus. Normal compressibility, respiratory phasicity and response to augmentation. Calf Veins: No evidence of thrombus. Normal compressibility and flow on color Doppler imaging. Superficial Great Saphenous Vein: No evidence of thrombus. Normal compressibility and flow on color Doppler imaging. Venous Reflux:  None. Other Findings: Well-circumscribed anechoic fluid collection in the popliteal fossa measures approximately 4.7 x 1.2 x 2.2 cm and is consistent with a Baker's cyst. IMPRESSION: No evidence of left lower extremity deep venous thrombosis. Popliteal fossa Baker's cyst identified. Electronically Signed   By: Aletta Edouard M.D.   On: 10/05/2016 17:31    EKG:  Independently reviewed. Low voltage, Afib  Assessment/Plan  Acute Chronic diastolic CHF (congestive heart failure)  - I believe the most likely explanation for his SOB is a slow increase in volume over the last couple of months as he has been on a lower dose of lasix and living independently  - Though he has had dehydration before, he was apparently requiring upwards of 60-80mg  of lasix per chart review.  - He has SOB with lying flat, increasing LE and UE edema, + JVD.  BNP however is lower than previous, so another explanation such as pneumonia could be playing a part - He received 1 dose of IV lasix 40mg  in the ED.  - IV lasix 40mg  BID - Monitor urine output with strict I/O - daily weight - Transition to oral medications tomorrow if possible  CAP (community acquired pneumonia) - CXR with RLL infiltrate/effusion - Treat as CAP with rocephin and azithromycin - Hold fluids given volume overload - Trend WBC - O2 as needed to keep sats > 90, he is doing well on RA now - Continuous pulse ox - Sputum culture - BC X 2 - HIV - Urinary strep pneumo  Diabetes mellitus type II, non insulin dependent  - Continue metformin - Start SSI if needed during stay    Atrial fibrillation  - Continue beta blocker for rate control - Monitor pulse with vitals - Currently well controlled in the 80s  CKD - Baseline 1.1 - 1.3 which is where he is at now - Monitor Cr with diuresis   BPH - Apparently was due to start tamsulosin at home, but had not started yet - Hold tamsulosin for now - Monitor for urinary obstruction  HTN - BP [id number] systolic tonight - Continue metoprolol  Abnormal  UA - No symptoms reported of dysuria, etc - Recollect urine when able.     DVT prophylaxis: Lovenox Code Status: DNR Family Communication: Wife [name] [name] on phone Disposition Plan: Likely hospitalized for 2-3 days  Admission status: Med Surg Inpatient   Gilles Chiquito MD [facility] Pager [phone]  If 7PM-7AM, please contact night-coverage [email or web address] Password [email or web address]  10/06/2016, 1:31 AM

## 2016-10-06 NOTE — Progress Notes (Signed)
PROGRESS NOTE    [name] [name]  [id number] DOB: [birth year] DOA: 10/05/2016 PCP: Arlyss Repress, MD   Brief Narrative: 83 y.o. male with medical history significant of DM2, HTN, mild dementia, mild CHF, Afib, arthritis who presented with swelling, weight gain and SOB.   Assessment & Plan:   # Chronic diastolic CHF (congestive heart failure)  - Received 1 dose of IV Lasix, continue oral Lasix 40 twice a day. Patient's lower extremity edema looks chronic in nature. He has very mild elevation in BNP. He is in room air and denied shortness of breath today. - Monitor urine output with strict I/O - daily weight -Check echocardiogram.  # CAP (community acquired pneumonia) - CXR with RLL infiltrate/effusion. -Continue ceftriaxone and azithromycin. Patient is on room air. Follow-up culture results.  -order duoneb  # Diabetes mellitus type II, non insulin dependent  - hold metformin - Start sliding scale. Monitor blood sugar level    # Atrial fibrillation, persistent  - Continue beta blocker for rate control - Monitor pulse with vitals  # CKD stage 3:  - Baseline 1.1 - 1.3. Around baseline now. Monitor BMP. Avoid nephrotoxins.  # BPH - Continue Flomax.  # HTN - Monitor blood pressure. Continue metoprolol.  #Physical deconditioning: Discussed with the patient. He is willing to go to rehabilitation. PT, OT and social worker evaluation requested.  Active Problems:   Chronic diastolic CHF (congestive heart failure) ([facility])   Diabetes mellitus type II, non insulin dependent ([facility])   Atrial fibrillation ([facility])   CAP (community acquired pneumonia)  DVT prophylaxis: Lovenox subcutaneous Code Status: DNR/DNI Family Communication: No family present at bedside Disposition Plan: Likely discharge to rehabilitation in 1-2 days    Consultants:   None  Procedures: None Antimicrobials: Ceftriaxone and azithromycin since February 2  Subjective: Patient was seen and examined at  bedside. Patient reported feeling better but he still feels weak. Denied headache, dizziness, nausea, vomiting, chest pain or shortness of breath.   Objective: Vitals:   10/05/16 2000 10/05/16 2030 10/05/16 2220 10/06/16 0629  BP: 118/74 126/63 132/66 134/68  Pulse: 83 83 93 94  Resp: 21 21 20 18   Temp:   97.5 F (36.4 C) 97.5 F (36.4 C)  TempSrc:   Oral Oral  SpO2: 95% 96% 90% 93%  Weight:   85.3 kg (188 lb 1.6 oz) 84.8 kg (187 lb)  Height:   5\' 7"  (1.702 m)     Intake/Output Summary (Last 24 hours) at 10/06/16 1353 Last data filed at 10/06/16 1019  Gross per 24 hour  Intake              780 ml  Output              250 ml  Net              530 ml   Filed Weights   10/05/16 1535 10/05/16 2220 10/06/16 0629  Weight: 85.7 kg (189 lb) 85.3 kg (188 lb 1.6 oz) 84.8 kg (187 lb)    Examination:  General exam: Appears calm and comfortable  Respiratory system: Clear to auscultation. Respiratory effort normal. No wheezing or crackle Cardiovascular system: S1 & S2 heard, RRR.   Gastrointestinal system: Abdomen is nondistended, soft and nontender. Normal bowel sounds heard. Central nervous system: Alert and oriented. No focal neurological deficits. Extremities: Bilateral lower extremities chronic edema and stasis changes Skin: No rashes, lesions or ulcers Psychiatry: Judgement and insight appear normal. Mood & affect appropriate for age  Data Reviewed: I have personally reviewed following labs and imaging studies  CBC:  Recent Labs Lab 10/05/16 1620 10/06/16 0000  WBC 6.4 8.2  NEUTROABS 5.2  --   HGB 11.1* 10.9*  HCT 34.2* 33.8*  MCV 100.3* 99.4  PLT 91* [id number]*   Basic Metabolic Panel:  Recent Labs Lab 10/05/16 1620 10/06/16 0000  NA 137  --   K 3.7  --   CL 96*  --   CO2 30  --   GLUCOSE 95  --   BUN 24*  --   CREATININE 1.18 1.29*  CALCIUM 9.0  --    GFR: Estimated Creatinine Clearance: 39.6 mL/min (by C-G formula based on SCr of 1.29 mg/dL  (H)). Liver Function Tests:  Recent Labs Lab 10/05/16 1620  AST 55*  ALT 33  ALKPHOS 98  BILITOT 2.0*  PROT 6.1*  ALBUMIN 3.5   No results for input(s): LIPASE, AMYLASE in the last 168 hours. No results for input(s): AMMONIA in the last 168 hours. Coagulation Profile: No results for input(s): INR, PROTIME in the last 168 hours. Cardiac Enzymes:  Recent Labs Lab 10/05/16 1620  TROPONINI <0.03   BNP (last 3 results) No results for input(s): PROBNP in the last 8760 hours. HbA1C: No results for input(s): HGBA1C in the last 72 hours. CBG: No results for input(s): GLUCAP in the last 168 hours. Lipid Profile: No results for input(s): CHOL, HDL, LDLCALC, TRIG, CHOLHDL, LDLDIRECT in the last 72 hours. Thyroid Function Tests: No results for input(s): TSH, T4TOTAL, FREET4, T3FREE, THYROIDAB in the last 72 hours. Anemia Panel: No results for input(s): VITAMINB12, FOLATE, FERRITIN, TIBC, IRON, RETICCTPCT in the last 72 hours. Sepsis Labs: No results for input(s): PROCALCITON, LATICACIDVEN in the last 168 hours.  Recent Results (from the past 240 hour(s))  MRSA PCR Screening     Status: None   Collection Time: 10/05/16 11:07 PM  Result Value Ref Range Status   MRSA by PCR NEGATIVE NEGATIVE Final    Comment:        The GeneXpert MRSA Assay (FDA approved for NASAL specimens only), is one component of a comprehensive MRSA colonization surveillance program. It is not intended to diagnose MRSA infection nor to guide or monitor treatment for MRSA infections.          Radiology Studies: Dg Chest 2 View  Result Date: 10/05/2016 CLINICAL DATA:  Cough, shortness of breath. EXAM: CHEST  2 VIEW COMPARISON:  Radiographs of March 31, 2014. FINDINGS: Stable cardiomegaly. Atherosclerosis of thoracic aorta is noted. Single lead left-sided pacemaker is unchanged in position. No pneumothorax is noted. Stable left basilar scarring is noted. Increased right basilar opacity is noted  concerning for worsening atelectasis or infiltrate with associated pleural effusion. Bony thorax is unremarkable. IMPRESSION: Aortic atherosclerosis. Increased right basilar opacity is noted concerning for worsening pneumonia or atelectasis with associated pleural effusion. Electronically Signed   By: Marijo Conception, M.D.   On: 10/05/2016 16:42   US Venous Img Lower Unilateral Left  Result Date: 10/05/2016 CLINICAL DATA:  Left lower extremity edema. EXAM: LEFT LOWER EXTREMITY VENOUS DOPPLER ULTRASOUND TECHNIQUE: Gray-scale sonography with graded compression, as well as color Doppler and duplex ultrasound were performed to evaluate the lower extremity deep venous systems from the level of the common femoral vein and including the common femoral, femoral, profunda femoral, popliteal and calf veins including the posterior tibial, peroneal and gastrocnemius veins when visible. The superficial great saphenous vein was also interrogated. Spectral Doppler was utilized  to evaluate flow at rest and with distal augmentation maneuvers in the common femoral, femoral and popliteal veins. COMPARISON:  None. FINDINGS: Contralateral Common Femoral Vein: Respiratory phasicity is normal and symmetric with the symptomatic side. No evidence of thrombus. Normal compressibility. Common Femoral Vein: No evidence of thrombus. Normal compressibility, respiratory phasicity and response to augmentation. Saphenofemoral Junction: No evidence of thrombus. Normal compressibility and flow on color Doppler imaging. Profunda Femoral Vein: No evidence of thrombus. Normal compressibility and flow on color Doppler imaging. Femoral Vein: No evidence of thrombus. Normal compressibility, respiratory phasicity and response to augmentation. Popliteal Vein: No evidence of thrombus. Normal compressibility, respiratory phasicity and response to augmentation. Calf Veins: No evidence of thrombus. Normal compressibility and flow on color Doppler imaging.  Superficial Great Saphenous Vein: No evidence of thrombus. Normal compressibility and flow on color Doppler imaging. Venous Reflux:  None. Other Findings: Well-circumscribed anechoic fluid collection in the popliteal fossa measures approximately 4.7 x 1.2 x 2.2 cm and is consistent with a Baker's cyst. IMPRESSION: No evidence of left lower extremity deep venous thrombosis. Popliteal fossa Baker's cyst identified. Electronically Signed   By: Aletta Edouard M.D.   On: 10/05/2016 17:31        Scheduled Meds: . azithromycin  500 mg Oral Q24H  . cefTRIAXone (ROCEPHIN)  IV  1 g Intravenous Q24H  . enoxaparin (LOVENOX) injection  40 mg Subcutaneous Q24H  . ferrous sulfate  325 mg Oral BID WC  . furosemide  40 mg Intravenous BID  . furosemide  40 mg Oral Daily  . metFORMIN  500 mg Oral Q breakfast  . metoprolol succinate  25 mg Oral Q1200  . multivitamin  15 mL Oral Q1200  . tamsulosin  0.4 mg Oral QHS   Continuous Infusions:   LOS: 1 day    Kamera Dubas Tanna Furry, MD [facility] Pager [phone]  If 7PM-7AM, please contact night-coverage [email or web address] Password [email or web address] 10/06/2016, 1:53 PM

## 2016-10-07 ENCOUNTER — Inpatient Hospital Stay (HOSPITAL_COMMUNITY): Payer: Medicare Other

## 2016-10-07 DIAGNOSIS — E119 Type 2 diabetes mellitus without complications: Secondary | ICD-10-CM

## 2016-10-07 DIAGNOSIS — I509 Heart failure, unspecified: Secondary | ICD-10-CM

## 2016-10-07 DIAGNOSIS — I481 Persistent atrial fibrillation: Secondary | ICD-10-CM

## 2016-10-07 DIAGNOSIS — J189 Pneumonia, unspecified organism: Secondary | ICD-10-CM

## 2016-10-07 LAB — ECHOCARDIOGRAM COMPLETE
E decel time: 232 ms
E/e' ratio: 7.91
FS: 28 % (ref 28–44)
Height: 67 in
IVS/LV PW RATIO, ED: 1.03
LA ID, A-P, ES: 71 mm
LA diam end sys: 71 mm
LA diam index: 3.62 cm/m2
LA vol A4C: 245 mL
LA vol index: 136.2 mL/m2
LA vol: 267 mL
LV E/e' medial: 7.91
LV E/e'average: 7.91
LV PW d: 9.17 mm — AB (ref 0.6–1.1)
LV e' LATERAL: 13.9 cm/s
LVOT SV: 49 mL
LVOT VTI: 17.1 cm
LVOT area: 2.84 cm2
LVOT diameter: 19 mm
LVOT peak vel: 68 cm/s
Lateral S' vel: 8.67 cm/s
MV Dec: 232
MV Peak grad: 5 mmHg
MV pk A vel: 39.9 m/s
MV pk E vel: 110 m/s
PISA EROA: 0.2 cm2
TAPSE: 19.4 mm
TDI e' lateral: 13.9
TDI e' medial: 7.15
VTI: 194 cm
Weight: 2967.43 [oz_av]

## 2016-10-07 LAB — EXPECTORATED SPUTUM ASSESSMENT W GRAM STAIN, RFLX TO RESP C

## 2016-10-07 LAB — BASIC METABOLIC PANEL
ANION GAP: 10 (ref 5–15)
BUN: 22 mg/dL — ABNORMAL HIGH (ref 6–20)
CALCIUM: 8.8 mg/dL — AB (ref 8.9–10.3)
CHLORIDE: 98 mmol/L — AB (ref 101–111)
CO2: 30 mmol/L (ref 22–32)
Creatinine, Ser: 1.39 mg/dL — ABNORMAL HIGH (ref 0.61–1.24)
GFR calc Af Amer: 50 mL/min — ABNORMAL LOW (ref >60)
GFR calc non Af Amer: 43 mL/min — ABNORMAL LOW (ref >60)
GLUCOSE: 86 mg/dL (ref 65–99)
Potassium: 3.5 mmol/L (ref 3.5–5.1)
Sodium: 138 mmol/L (ref 135–145)

## 2016-10-07 LAB — GLUCOSE, CAPILLARY
GLUCOSE-CAPILLARY: 110 mg/dL — AB (ref 65–99)
GLUCOSE-CAPILLARY: 130 mg/dL — AB (ref 65–99)
Glucose-Capillary: 89 mg/dL (ref 65–99)

## 2016-10-07 LAB — EXPECTORATED SPUTUM ASSESSMENT W REFEX TO RESP CULTURE

## 2016-10-07 MED ORDER — FUROSEMIDE 40 MG PO TABS
40.0000 mg | ORAL_TABLET | Freq: Two times a day (BID) | ORAL | Status: DC
  Administered 2016-10-07: 40 mg via ORAL
  Filled 2016-10-07: qty 1

## 2016-10-07 NOTE — Progress Notes (Signed)
PROGRESS NOTE    [name] [name]  [id number] DOB: [birth year] DOA: 10/05/2016 PCP: Arlyss Repress, MD   Brief Narrative: 83 y.o. male with medical history significant of DM2, HTN, mild dementia, mild CHF, Afib, arthritis who presented with swelling, weight gain and SOB.   Assessment & Plan:   # Chronic diastolic CHF (congestive heart failure)  -Treated with IV Lasix for lower extremity edema. He is to oral Lasix today because of increase in serum creatinine level. Patient's lower extremity edema looks chronic in nature. He has very mild elevation in BNP. He is in room air and denied shortness of breath today. - Monitor urine output with strict I/O - daily weight -Pending echocardiogram.  # CAP (community acquired pneumonia) - CXR with RLL infiltrate/effusion. -Continue ceftriaxone and azithromycin. Patient is on room air. Follow-up culture results.  -order duoneb, stable  # Diabetes mellitus type II, non insulin dependent  - hold metformin - Start sliding scale. Monitor blood sugar level    # Atrial fibrillation, persistent  - Continue beta blocker for rate control - Monitor pulse with vitals  # CKD stage 3:  - Baseline 1.1 - 1.3. Serum creatinine level mildly elevated to 1.3 today, still around baseline. Monitor BMP. Avoid nephrotoxins.  # BPH - Continue Flomax.  # HTN - Monitor blood pressure. Continue metoprolol.  #Physical deconditioning: Discussed with the patien and his wife at bedside. They're willing to try rehabilitation. PT, OT and social worker evaluation requested.  Active Problems:   Chronic diastolic CHF (congestive heart failure) (HCC)   Diabetes mellitus type II, non insulin dependent (HCC)   Atrial fibrillation (HCC)   CAP (community acquired pneumonia)  DVT prophylaxis: Lovenox subcutaneous Code Status: DNR/DNI Family Communication: Discussed with the patient's wife at bedside Disposition Plan: Likely discharge to rehabilitation in 1-2  days    Consultants:   None  Procedures: None Antimicrobials: Ceftriaxone and azithromycin since February 2  Subjective: Patient was seen and examined at bedside. Reported feeling better. Denied headache, dizziness, nausea, vomiting, chest pain or shortness of breath. Wife at bedside.   Objective: Vitals:   10/06/16 2209 10/07/16 0500 10/07/16 0555 10/07/16 1258  BP: (!) 146/62  (!) 98/59 132/62  Pulse: 88  87 88  Resp: 18  19   Temp: 97.9 F (36.6 C)  99.2 F (37.3 C)   TempSrc: Oral  Oral   SpO2: 98%  94%   Weight:  84.1 kg (185 lb 7.4 oz)    Height:        Intake/Output Summary (Last 24 hours) at 10/07/16 1323 Last data filed at 10/07/16 0500  Gross per 24 hour  Intake              720 ml  Output                0 ml  Net              720 ml   Filed Weights   10/05/16 2220 10/06/16 0629 10/07/16 0500  Weight: 85.3 kg (188 lb 1.6 oz) 84.8 kg (187 lb) 84.1 kg (185 lb 7.4 oz)    Examination:  General exam: Pleasant elderly male lying on bed comfortable, not in distress Respiratory system: Clear bilaterally, respiratory effort normal, no wheezing or crackle Cardiovascular system: Regular rate rhythm, S1-S2 normal. Gastrointestinal system: Abdomen is nondistended, soft and nontender. Normal bowel sounds heard. Central nervous system: Alert and oriented. No focal neurological deficits. Extremities: Bilateral lower extremities chronic edema and stasis changes,  unchanged Skin: No rashes, lesions or ulcers Psychiatry: Judgement and insight appear normal. Mood & affect appropriate for age     Data Reviewed: I have personally reviewed following labs and imaging studies  CBC:  Recent Labs Lab 10/05/16 1620 10/06/16 0000  WBC 6.4 8.2  NEUTROABS 5.2  --   HGB 11.1* 10.9*  HCT 34.2* 33.8*  MCV 100.3* 99.4  PLT 91* 123XX123*   Basic Metabolic Panel:  Recent Labs Lab 10/05/16 1620 10/06/16 0000 10/07/16 0419  NA 137  --  138  K 3.7  --  3.5  CL 96*  --  98*   CO2 30  --  30  GLUCOSE 95  --  86  BUN 24*  --  22*  CREATININE 1.18 1.29* 1.39*  CALCIUM 9.0  --  8.8*   GFR: Estimated Creatinine Clearance: 36.6 mL/min (by C-G formula based on SCr of 1.39 mg/dL (H)). Liver Function Tests:  Recent Labs Lab 10/05/16 1620  AST 55*  ALT 33  ALKPHOS 98  BILITOT 2.0*  PROT 6.1*  ALBUMIN 3.5   No results for input(s): LIPASE, AMYLASE in the last 168 hours. No results for input(s): AMMONIA in the last 168 hours. Coagulation Profile: No results for input(s): INR, PROTIME in the last 168 hours. Cardiac Enzymes:  Recent Labs Lab 10/05/16 1620  TROPONINI <0.03   BNP (last 3 results) No results for input(s): PROBNP in the last 8760 hours. HbA1C: No results for input(s): HGBA1C in the last 72 hours. CBG:  Recent Labs Lab 10/06/16 1619 10/06/16 2202 10/07/16 0750 10/07/16 1201  GLUCAP 123* 96 89 110*   Lipid Profile: No results for input(s): CHOL, HDL, LDLCALC, TRIG, CHOLHDL, LDLDIRECT in the last 72 hours. Thyroid Function Tests: No results for input(s): TSH, T4TOTAL, FREET4, T3FREE, THYROIDAB in the last 72 hours. Anemia Panel: No results for input(s): VITAMINB12, FOLATE, FERRITIN, TIBC, IRON, RETICCTPCT in the last 72 hours. Sepsis Labs: No results for input(s): PROCALCITON, LATICACIDVEN in the last 168 hours.  Recent Results (from the past 240 hour(s))  MRSA PCR Screening     Status: None   Collection Time: 10/05/16 11:07 PM  Result Value Ref Range Status   MRSA by PCR NEGATIVE NEGATIVE Final    Comment:        The GeneXpert MRSA Assay (FDA approved for NASAL specimens only), is one component of a comprehensive MRSA colonization surveillance program. It is not intended to diagnose MRSA infection nor to guide or monitor treatment for MRSA infections.   Culture, blood (routine x 2) Call MD if unable to obtain prior to antibiotics being given     Status: None (Preliminary result)   Collection Time: 10/05/16 11:57 PM   Result Value Ref Range Status   Specimen Description BLOOD RIGHT ARM  Final   Special Requests AEROBIC BOTTLE ONLY 8ML  Final   Culture NO GROWTH 1 DAY  Final   Report Status PENDING  Incomplete  Culture, blood (routine x 2) Call MD if unable to obtain prior to antibiotics being given     Status: None (Preliminary result)   Collection Time: 10/06/16 12:04 AM  Result Value Ref Range Status   Specimen Description BLOOD RIGHT FOREARM  Final   Special Requests BOTTLES DRAWN AEROBIC AND ANAEROBIC 10ML  Final   Culture NO GROWTH 1 DAY  Final   Report Status PENDING  Incomplete  Culture, sputum-assessment     Status: None   Collection Time: 10/06/16  5:48 PM  Result  Value Ref Range Status   Specimen Description EXPECTORATED SPUTUM  Final   Special Requests NONE  Final   Sputum evaluation THIS SPECIMEN IS ACCEPTABLE FOR SPUTUM CULTURE  Final   Report Status 10/07/2016 FINAL  Final  Culture, respiratory (NON-Expectorated)     Status: None (Preliminary result)   Collection Time: 10/06/16  5:48 PM  Result Value Ref Range Status   Specimen Description EXPECTORATED SPUTUM  Final   Special Requests NONE Reflexed from [id number]  Final   Gram Stain   Final    ABUNDANT WBC PRESENT, PREDOMINANTLY PMN MODERATE GRAM POSITIVE COCCI IN PAIRS FEW GRAM NEGATIVE RODS FEW GRAM NEGATIVE COCCOBACILLI    Culture PENDING  Incomplete   Report Status PENDING  Incomplete         Radiology Studies: Dg Chest 2 View  Result Date: 10/05/2016 CLINICAL DATA:  Cough, shortness of breath. EXAM: CHEST  2 VIEW COMPARISON:  Radiographs of March 31, 2014. FINDINGS: Stable cardiomegaly. Atherosclerosis of thoracic aorta is noted. Single lead left-sided pacemaker is unchanged in position. No pneumothorax is noted. Stable left basilar scarring is noted. Increased right basilar opacity is noted concerning for worsening atelectasis or infiltrate with associated pleural effusion. Bony thorax is unremarkable. IMPRESSION: Aortic  atherosclerosis. Increased right basilar opacity is noted concerning for worsening pneumonia or atelectasis with associated pleural effusion. Electronically Signed   By: Marijo Conception, M.D.   On: 10/05/2016 16:42   US Venous Img Lower Unilateral Left  Result Date: 10/05/2016 CLINICAL DATA:  Left lower extremity edema. EXAM: LEFT LOWER EXTREMITY VENOUS DOPPLER ULTRASOUND TECHNIQUE: Gray-scale sonography with graded compression, as well as color Doppler and duplex ultrasound were performed to evaluate the lower extremity deep venous systems from the level of the common femoral vein and including the common femoral, femoral, profunda femoral, popliteal and calf veins including the posterior tibial, peroneal and gastrocnemius veins when visible. The superficial great saphenous vein was also interrogated. Spectral Doppler was utilized to evaluate flow at rest and with distal augmentation maneuvers in the common femoral, femoral and popliteal veins. COMPARISON:  None. FINDINGS: Contralateral Common Femoral Vein: Respiratory phasicity is normal and symmetric with the symptomatic side. No evidence of thrombus. Normal compressibility. Common Femoral Vein: No evidence of thrombus. Normal compressibility, respiratory phasicity and response to augmentation. Saphenofemoral Junction: No evidence of thrombus. Normal compressibility and flow on color Doppler imaging. Profunda Femoral Vein: No evidence of thrombus. Normal compressibility and flow on color Doppler imaging. Femoral Vein: No evidence of thrombus. Normal compressibility, respiratory phasicity and response to augmentation. Popliteal Vein: No evidence of thrombus. Normal compressibility, respiratory phasicity and response to augmentation. Calf Veins: No evidence of thrombus. Normal compressibility and flow on color Doppler imaging. Superficial Great Saphenous Vein: No evidence of thrombus. Normal compressibility and flow on color Doppler imaging. Venous Reflux:   None. Other Findings: Well-circumscribed anechoic fluid collection in the popliteal fossa measures approximately 4.7 x 1.2 x 2.2 cm and is consistent with a Baker's cyst. IMPRESSION: No evidence of left lower extremity deep venous thrombosis. Popliteal fossa Baker's cyst identified. Electronically Signed   By: Aletta Edouard M.D.   On: 10/05/2016 17:31        Scheduled Meds: . azithromycin  500 mg Oral Q24H  . cefTRIAXone (ROCEPHIN)  IV  1 g Intravenous Q24H  . enoxaparin (LOVENOX) injection  40 mg Subcutaneous Q24H  . ferrous sulfate  325 mg Oral BID WC  . furosemide  40 mg Oral BID  . insulin aspart  0-9 Units Subcutaneous TID WC  . metoprolol succinate  25 mg Oral Q1200  . multivitamin  15 mL Oral Q1200  . tamsulosin  0.4 mg Oral QHS   Continuous Infusions:   LOS: 2 days    Jermaine Tholl Tanna Furry, MD [facility] Pager [phone]  If 7PM-7AM, please contact night-coverage [email or web address] Password [email or web address] 10/07/2016, 1:23 PM

## 2016-10-07 NOTE — Evaluation (Signed)
Physical Therapy Evaluation Patient Details Name: [name] [name] MRN: [id number] DOB: 1923-12-12 Today's Date: 10/07/2016   History of Present Illness  [name] [name] is a 83 y.o. male with medical history significant of DM2, HTN, mild dementia, mild CHF with EF of 50-55%, Afib, arthritis who presented with swelling, weight gain and SOB; Admitted with CAP as well as CHF  Clinical Impression   Pt admitted with above diagnosis. Pt currently with functional limitations due to the deficits listed below (see PT Problem List). Session conducted on Room Air; Quite fatigued at end of walk, O2 sats 93% and increased to 97% with seated rest;  Pt will benefit from skilled PT to increase their independence and safety with mobility to allow discharge to the venue listed below.       Follow Up Recommendations SNF (Rehab section of [facility])    Clinical biochemist with 5" wheels;3in1 (PT)    Recommendations for Other Services       Precautions / Restrictions Precautions Precautions: Fall      Mobility  Bed Mobility                  Transfers Overall transfer level: Needs assistance Equipment used: Rolling walker (2 wheeled) Transfers: Sit to/from Stand Sit to Stand: Mod assist         General transfer comment: Moderate assist to power up  Ambulation/Gait Ambulation/Gait assistance: Mod assist Ambulation Distance (Feet): 40 Feet Assistive device: Rolling walker (2 wheeled) Gait Pattern/deviations: Decreased step length - right;Decreased step length - left;Decreased stride length;Trunk flexed Gait velocity: Decr   General Gait Details: Cues for posture and RW proximity; noting a tendency to "freeze" with the need for tactile cueing to continue moving when faced with turns or backing up to the recliner  Stairs            Wheelchair Mobility    Modified Rankin (Stroke Patients Only)       Balance Overall balance assessment: Needs  assistance Sitting-balance support: Feet supported Sitting balance-[name]: Fair       Standing balance-[name]: Poor                               Pertinent Vitals/Pain      Home Living Family/patient expects to be discharged to:: Skilled nursing facility                 Additional Comments: [place] at [facility]; plans to do rehab prior to returning to [facility]    Prior Function Level of Independence: Independent               Hand Dominance   Dominant Hand: Right    Extremity/Trunk Assessment   Upper Extremity Assessment Upper Extremity Assessment: Generalized weakness    Lower Extremity Assessment Lower Extremity Assessment: Generalized weakness       Communication   Communication: No difficulties  Cognition Arousal/Alertness: Awake/alert Behavior During Therapy: WFL for tasks assessed/performed Overall Cognitive Status: History of cognitive impairments - at baseline                 General Comments: Noting some decr inititation, decr problem-solving, requires verbal/tactile cues    General Comments General comments (skin integrity, edema, etc.): Educated pt and wife on using the incentive spirometer    Exercises     Assessment/Plan    PT Assessment Patient needs continued PT services  PT  Problem List Decreased strength;Decreased activity tolerance;Decreased balance;Decreased mobility;Decreased coordination;Decreased cognition;Decreased knowledge of use of DME;Decreased safety awareness;Decreased knowledge of precautions;Cardiopulmonary status limiting activity          PT Treatment Interventions DME instruction;Gait training;Functional mobility training;Therapeutic activities;Therapeutic exercise;Balance training;Neuromuscular re-education;Cognitive remediation;Patient/family education    PT Goals (Current goals can be found in the Care Plan section)  Acute Rehab PT Goals Patient Stated Goal: Did not  state PT Goal Formulation: With patient/family Time For Goal Achievement: 10/21/16 Potential to Achieve Goals: Good    Frequency Min 3X/week   Barriers to discharge        Co-evaluation               End of Session Equipment Utilized During Treatment: Gait belt Activity Tolerance: Patient tolerated treatment well (though quite fatigued once back in room) Patient left: in chair;with call bell/phone within reach;with family/visitor present Nurse Communication: Mobility status         Time: [id number] PT Time Calculation (min) (ACUTE ONLY): 33 min   Charges:   PT Evaluation $PT Eval Moderate Complexity: 1 Procedure PT Treatments $Gait Training: 8-22 mins   PT G CodesColletta [place] 10/07/2016, 10:58 AM  Roney [place], [place] Pager [phone] Office [phone]

## 2016-10-07 NOTE — Progress Notes (Signed)
  Echocardiogram 2D Echocardiogram has been performed.  [name] [name] 10/07/2016, 3:06 PM

## 2016-10-08 DIAGNOSIS — N189 Chronic kidney disease, unspecified: Secondary | ICD-10-CM

## 2016-10-08 DIAGNOSIS — N179 Acute kidney failure, unspecified: Secondary | ICD-10-CM

## 2016-10-08 LAB — URINALYSIS, ROUTINE W REFLEX MICROSCOPIC
Bilirubin Urine: NEGATIVE
GLUCOSE, UA: NEGATIVE mg/dL
HGB URINE DIPSTICK: NEGATIVE
KETONES UR: NEGATIVE mg/dL
LEUKOCYTES UA: NEGATIVE
NITRITE: NEGATIVE
PH: 5 (ref 5.0–8.0)
Protein, ur: 30 mg/dL — AB
Specific Gravity, Urine: 1.015 (ref 1.005–1.030)

## 2016-10-08 LAB — BASIC METABOLIC PANEL
Anion gap: 13 (ref 5–15)
BUN: 26 mg/dL — ABNORMAL HIGH (ref 6–20)
CALCIUM: 9 mg/dL (ref 8.9–10.3)
CO2: 30 mmol/L (ref 22–32)
Chloride: 95 mmol/L — ABNORMAL LOW (ref 101–111)
Creatinine, Ser: 1.51 mg/dL — ABNORMAL HIGH (ref 0.61–1.24)
GFR, EST AFRICAN AMERICAN: 45 mL/min — AB (ref >60)
GFR, EST NON AFRICAN AMERICAN: 39 mL/min — AB (ref >60)
Glucose, Bld: 106 mg/dL — ABNORMAL HIGH (ref 65–99)
Potassium: 3.4 mmol/L — ABNORMAL LOW (ref 3.5–5.1)
SODIUM: 138 mmol/L (ref 135–145)

## 2016-10-08 LAB — GLUCOSE, CAPILLARY
Glucose-Capillary: 113 mg/dL — ABNORMAL HIGH (ref 65–99)
Glucose-Capillary: 94 mg/dL (ref 65–99)
Glucose-Capillary: 118 mg/dL — ABNORMAL HIGH (ref 65–99)
Glucose-Capillary: 120 mg/dL — ABNORMAL HIGH (ref 65–99)
Glucose-Capillary: 127 mg/dL — ABNORMAL HIGH (ref 65–99)

## 2016-10-08 MED ORDER — IPRATROPIUM-ALBUTEROL 0.5-2.5 (3) MG/3ML IN SOLN
3.0000 mL | RESPIRATORY_TRACT | Status: DC | PRN
Start: 2016-10-08 — End: 2016-10-09

## 2016-10-08 MED ORDER — IPRATROPIUM-ALBUTEROL 0.5-2.5 (3) MG/3ML IN SOLN
3.0000 mL | Freq: Four times a day (QID) | RESPIRATORY_TRACT | Status: DC
  Filled 2016-10-08: qty 3

## 2016-10-08 NOTE — Evaluation (Signed)
Occupational Therapy Evaluation Patient Details Name: [name] [name] MRN: [id number] DOB: 05/22/1924 Today's Date: 10/08/2016    History of Present Illness [name] [name] is a 83 y.o. male with medical history significant of DM2, HTN, mild dementia, mild CHF with EF of 50-55%, Afib, arthritis who presented with swelling, weight gain and SOB; Admitted with CAP as well as CHF   Clinical Impression   Pt with decline in function and safety with ADLs and ADL mobility with decreased strength, balance and endurance and hx of cognitive impairments. Pt would benefit from acute OT services to address impairments to increase level of function and safety    Follow Up Recommendations  SNF;Supervision/Assistance - 24 hour    Equipment Recommendations  None recommended by OT;Other (comment) (TBD at next venue of care)    Recommendations for Other Services       Precautions / Restrictions Precautions Precautions: Fall Restrictions Weight Bearing Restrictions: No      Mobility Bed Mobility Overal bed mobility: Needs Assistance Bed Mobility: Supine to Sit     Supine to sit: Min assist     General bed mobility comments: min A with LEs to edge of bed, multimodal cues to initiate  Transfers Overall transfer level: Needs assistance Equipment used: Rolling walker (2 wheeled) Transfers: Sit to/from Stand Sit to Stand: Mod assist         General transfer comment: Moderate assist to power up    Balance Overall balance assessment: Needs assistance Sitting-balance support: Feet supported Sitting balance-[name]: Fair     Standing balance support: Single extremity supported;Bilateral upper extremity supported;During functional activity Standing balance-[name]: Poor                              ADL Overall ADL's : Needs assistance/impaired     Grooming: Wash/dry hands;Wash/dry face;Minimal assistance;Standing   Upper Body Bathing: Min guard;Sitting   Lower Body  Bathing: Maximal assistance   Upper Body Dressing : Min guard;Sitting   Lower Body Dressing: Maximal assistance   Toilet Transfer: Moderate assistance;RW;Ambulation;BSC;Cueing for safety;Cueing for sequencing   Toileting- Clothing Manipulation and Hygiene: Moderate assistance       Functional mobility during ADLs: Rolling walker;Moderate assistance;Cueing for safety;Cueing for sequencing       Vision Vision Assessment?: No apparent visual deficits          Pertinent Vitals/Pain Pain Assessment: Faces Pain Score: 4  Faces Pain Scale: Hurts little more Pain Location: B LEs with movement Pain Descriptors / Indicators: Sore Pain Intervention(s): Monitored during session;Repositioned     Hand Dominance Right   Extremity/Trunk Assessment Upper Extremity Assessment Upper Extremity Assessment: Generalized weakness   Lower Extremity Assessment Lower Extremity Assessment: Defer to PT evaluation       Communication Communication Communication: No difficulties   Cognition Arousal/Alertness: Awake/alert Behavior During Therapy: WFL for tasks assessed/performed Overall Cognitive Status: History of cognitive impairments - at baseline                 General Comments: Noting some decr inititation, decr problem-solving, requires verbal/tactile cues   General Comments   pt very pleasant and cooperative, mild confusion                 Home Living Family/patient expects to be discharged to:: Skilled nursing facility  Additional Comments: [place] at [facility]; plans to do rehab prior to returning to [facility]      Prior Functioning/Environment Level of Independence: Independent                 OT Problem List: Decreased strength;Impaired balance (sitting and/or standing);Decreased cognition;Pain;Decreased activity tolerance;Decreased knowledge of use of DME or AE;Decreased safety awareness   OT  Treatment/Interventions: Self-care/ADL training;DME and/or AE instruction;Therapeutic activities;Patient/family education    OT Goals(Current goals can be found in the care plan section) Acute Rehab OT Goals Patient Stated Goal: feel better OT Goal Formulation: With patient/family Time For Goal Achievement: 10/15/16 Potential to Achieve Goals: Good ADL Goals Pt Will Perform Grooming: with min guard assist;standing;with caregiver independent in assisting Pt Will Perform Upper Body Bathing: with supervision;with set-up;sitting Pt Will Perform Lower Body Bathing: with mod assist;sitting/lateral leans;sit to/from stand;with caregiver independent in assisting Pt Will Perform Upper Body Dressing: with set-up;with supervision;sitting Pt Will Transfer to Toilet: with min assist;ambulating Pt Will Perform Toileting - Clothing Manipulation and hygiene: with mod assist;with min assist;sitting/lateral leans;sit to/from stand  OT Frequency: Min 2X/week   Barriers to D/C:    no barriers, will d/c to SNF at [place] During Treatment: Gait belt;Rolling walker;Other (comment) (BSC)  Activity Tolerance: Patient tolerated treatment well Patient left: in chair;with call bell/phone within reach;with family/visitor present   Time: 1221-1259 OT Time Calculation (min): 38 min Charges:  OT General Charges $OT Visit: 1 Procedure OT Evaluation $OT Eval Moderate Complexity: 1 Procedure OT Treatments $Self Care/Home Management : 8-22 mins $Therapeutic Activity: 8-22 mins G-Codes:    Britt Bottom 10/08/2016, 2:26 PM

## 2016-10-08 NOTE — Progress Notes (Signed)
Held metoprolol per MD order and changed parameters on metoprolol per MD order.

## 2016-10-08 NOTE — Progress Notes (Signed)
PROGRESS NOTE    Toshio Mckiernan  [id number] DOB: [birth year] DOA: 10/05/2016 PCP: Arlyss Repress, MD   Brief Narrative: 83 y.o. male with medical history significant of DM2, HTN, mild dementia, mild CHF, Afib, arthritis who presented with swelling, weight gain and SOB.   Assessment & Plan:   # Chronic diastolic CHF (congestive heart failure)  -Patient initially received IV Lasix and later switched to oral Lasix for lower extremity edema. Today his serum creatinine level elevated to 1.5 therefore holding diuretics. Continue to monitor urine output. Repeat lab in the morning. - Patient's lower extremity edema looks chronic in nature. He has very mild elevation in BNP. He is not hypoxic and denies shortness of breath. - Monitor urine output with strict I/O - daily weight - echocardiogram with normal systolic function EF 0000000 and normal wall motion.  # CAP (community acquired pneumonia) - CXR with RLL infiltrate/effusion. -Continue ceftriaxone and azithromycin. Patient is not hypoxic. Cultures negative so far. May be able to switch to oral antibiotics on discharge. -order duoneb, stable  # Diabetes mellitus type II, non insulin dependent  - hold metformin - Start sliding scale. Monitor blood sugar level    # Atrial fibrillation, persistent  - Continue beta blocker for rate control - Monitor pulse with vitals  # AKI on CKD stage 3:  - Baseline 1.1 - 1.3. Serum creatinine level elevated to 1.5 today likely hemodynamically mediated in the setting of diuretics. Holding Lasix today. Repeat BMP in the morning. Check Ua.  Avoid nephrotoxins.  # BPH - Continue Flomax.  # HTN - Monitor blood pressure. Continue metoprolol.  #Physical deconditioning: Discussed with the patien and his son at bedside in detail. Order PT OT evaluation. Also consult social worker for rehabilitation placement. Patient will likely discharge tomorrow to rehabilitation if clinically improves.   Active  Problems:   Chronic diastolic CHF (congestive heart failure) (HCC)   Diabetes mellitus type II, non insulin dependent (HCC)   Atrial fibrillation (HCC)   CAP (community acquired pneumonia)  DVT prophylaxis: Lovenox subcutaneous Code Status: DNR/DNI Family Communication: Discussed with the patient's son at bedside Disposition Plan: Likely discharge to rehabilitation in 1-2 days    Consultants:   None  Procedures: None Antimicrobials: Ceftriaxone and azithromycin since February 2  Subjective: Patient was seen and examined at bedside. Feeling better. Has dry cough. Denied chest pain, shortness of breath, nausea vomiting and headache.   Objective: Vitals:   10/07/16 1258 10/07/16 1415 10/07/16 2205 10/08/16 0452  BP: 132/62 113/69 113/77 (!) 125/54  Pulse: 88 74 74 72  Resp:  18 17 17   Temp:  97.5 F (36.4 C) 97.4 F (36.3 C) 97.2 F (36.2 C)  TempSrc:  Oral Oral Oral  SpO2:  97% 100% 98%  Weight:      Height:        Intake/Output Summary (Last 24 hours) at 10/08/16 1148 Last data filed at 10/08/16 0940  Gross per 24 hour  Intake              290 ml  Output              300 ml  Net              -10 ml   Filed Weights   10/05/16 2220 10/06/16 0629 10/07/16 0500  Weight: 85.3 kg (188 lb 1.6 oz) 84.8 kg (187 lb) 84.1 kg (185 lb 7.4 oz)    Examination:  General exam: Pleasant elderly male lying on bed  comfortable, not in distress.  Respiratory system: Clear bilateral, no wheezing or crackle  Cardiovascular system: Regular rate rhythm, S1-S2 normal.. Gastrointestinal system: Abdomen is nondistended, soft and nontender. Normal bowel sounds heard. Central nervous system: Alert and oriented. No focal neurological deficits. Extremities: Bilateral lower extremities chronic edema and stasis changes, unchanged Skin: No rashes, lesions or ulcers Psychiatry: Judgement and insight appear normal. Mood & affect appropriate for age     Data Reviewed: I have personally  reviewed following labs and imaging studies  CBC:  Recent Labs Lab 10/05/16 1620 10/06/16 0000  WBC 6.4 8.2  NEUTROABS 5.2  --   HGB 11.1* 10.9*  HCT 34.2* 33.8*  MCV 100.3* 99.4  PLT 91* 123XX123*   Basic Metabolic Panel:  Recent Labs Lab 10/05/16 1620 10/06/16 0000 10/07/16 0419 10/08/16 0656  NA 137  --  138 138  K 3.7  --  3.5 3.4*  CL 96*  --  98* 95*  CO2 30  --  30 30  GLUCOSE 95  --  86 106*  BUN 24*  --  22* 26*  CREATININE 1.18 1.29* 1.39* 1.51*  CALCIUM 9.0  --  8.8* 9.0   GFR: Estimated Creatinine Clearance: 33.7 mL/min (by C-G formula based on SCr of 1.51 mg/dL (H)). Liver Function Tests:  Recent Labs Lab 10/05/16 1620  AST 55*  ALT 33  ALKPHOS 98  BILITOT 2.0*  PROT 6.1*  ALBUMIN 3.5   No results for input(s): LIPASE, AMYLASE in the last 168 hours. No results for input(s): AMMONIA in the last 168 hours. Coagulation Profile: No results for input(s): INR, PROTIME in the last 168 hours. Cardiac Enzymes:  Recent Labs Lab 10/05/16 1620  TROPONINI <0.03   BNP (last 3 results) No results for input(s): PROBNP in the last 8760 hours. HbA1C: No results for input(s): HGBA1C in the last 72 hours. CBG:  Recent Labs Lab 10/07/16 0750 10/07/16 1201 10/07/16 1737 10/07/16 2206 10/08/16 0914  GLUCAP 89 110* 113* 130* 94   Lipid Profile: No results for input(s): CHOL, HDL, LDLCALC, TRIG, CHOLHDL, LDLDIRECT in the last 72 hours. Thyroid Function Tests: No results for input(s): TSH, T4TOTAL, FREET4, T3FREE, THYROIDAB in the last 72 hours. Anemia Panel: No results for input(s): VITAMINB12, FOLATE, FERRITIN, TIBC, IRON, RETICCTPCT in the last 72 hours. Sepsis Labs: No results for input(s): PROCALCITON, LATICACIDVEN in the last 168 hours.  Recent Results (from the past 240 hour(s))  MRSA PCR Screening     Status: None   Collection Time: 10/05/16 11:07 PM  Result Value Ref Range Status   MRSA by PCR NEGATIVE NEGATIVE Final    Comment:        The  GeneXpert MRSA Assay (FDA approved for NASAL specimens only), is one component of a comprehensive MRSA colonization surveillance program. It is not intended to diagnose MRSA infection nor to guide or monitor treatment for MRSA infections.   Culture, blood (routine x 2) Call MD if unable to obtain prior to antibiotics being given     Status: None (Preliminary result)   Collection Time: 10/05/16 11:57 PM  Result Value Ref Range Status   Specimen Description BLOOD RIGHT ARM  Final   Special Requests AEROBIC BOTTLE ONLY 8ML  Final   Culture NO GROWTH 1 DAY  Final   Report Status PENDING  Incomplete  Culture, blood (routine x 2) Call MD if unable to obtain prior to antibiotics being given     Status: None (Preliminary result)   Collection Time:  10/06/16 12:04 AM  Result Value Ref Range Status   Specimen Description BLOOD RIGHT FOREARM  Final   Special Requests BOTTLES DRAWN AEROBIC AND ANAEROBIC 10ML  Final   Culture NO GROWTH 1 DAY  Final   Report Status PENDING  Incomplete  Culture, sputum-assessment     Status: None   Collection Time: 10/06/16  5:48 PM  Result Value Ref Range Status   Specimen Description EXPECTORATED SPUTUM  Final   Special Requests NONE  Final   Sputum evaluation THIS SPECIMEN IS ACCEPTABLE FOR SPUTUM CULTURE  Final   Report Status 10/07/2016 FINAL  Final  Culture, respiratory (NON-Expectorated)     Status: None (Preliminary result)   Collection Time: 10/06/16  5:48 PM  Result Value Ref Range Status   Specimen Description EXPECTORATED SPUTUM  Final   Special Requests NONE Reflexed from [id number]  Final   Gram Stain   Final    ABUNDANT WBC PRESENT, PREDOMINANTLY PMN MODERATE GRAM POSITIVE COCCI IN PAIRS FEW GRAM NEGATIVE RODS FEW GRAM NEGATIVE COCCOBACILLI    Culture CULTURE REINCUBATED FOR BETTER GROWTH  Final   Report Status PENDING  Incomplete         Radiology Studies: No results found.      Scheduled Meds: . azithromycin  500 mg Oral Q24H    . cefTRIAXone (ROCEPHIN)  IV  1 g Intravenous Q24H  . enoxaparin (LOVENOX) injection  40 mg Subcutaneous Q24H  . ferrous sulfate  325 mg Oral BID WC  . insulin aspart  0-9 Units Subcutaneous TID WC  . metoprolol succinate  25 mg Oral Q1200  . multivitamin  15 mL Oral Q1200  . tamsulosin  0.4 mg Oral QHS   Continuous Infusions:   LOS: 3 days    Tekila Caillouet Tanna Furry, MD [facility] Pager [phone]  If 7PM-7AM, please contact night-coverage [email or web address] Password [email or web address] 10/08/2016, 11:48 AM

## 2016-10-08 NOTE — Progress Notes (Signed)
Pt. Weaned off of O2 and maintained sats at 94% on room air. Pt. Shows no s/s of SOB or respiratory issues.

## 2016-10-08 NOTE — Progress Notes (Signed)
Social Work consulted and called to come and talk to family. Per social work, they will be up sometime today to talk to family.

## 2016-10-09 DIAGNOSIS — G3183 Dementia with Lewy bodies: Secondary | ICD-10-CM

## 2016-10-09 DIAGNOSIS — F0280 Dementia in other diseases classified elsewhere without behavioral disturbance: Secondary | ICD-10-CM

## 2016-10-09 LAB — BASIC METABOLIC PANEL WITH GFR
Anion gap: 15 (ref 5–15)
BUN: 30 mg/dL — ABNORMAL HIGH (ref 6–20)
CO2: 29 mmol/L (ref 22–32)
Calcium: 8.9 mg/dL (ref 8.9–10.3)
Chloride: 93 mmol/L — ABNORMAL LOW (ref 101–111)
Creatinine, Ser: 1.45 mg/dL — ABNORMAL HIGH (ref 0.61–1.24)
GFR calc Af Amer: 47 mL/min — ABNORMAL LOW
GFR calc non Af Amer: 41 mL/min — ABNORMAL LOW
Glucose, Bld: 86 mg/dL (ref 65–99)
Potassium: 3.2 mmol/L — ABNORMAL LOW (ref 3.5–5.1)
Sodium: 137 mmol/L (ref 135–145)

## 2016-10-09 LAB — CULTURE, RESPIRATORY: CULTURE: NORMAL

## 2016-10-09 LAB — GLUCOSE, CAPILLARY
GLUCOSE-CAPILLARY: 104 mg/dL — AB (ref 65–99)
Glucose-Capillary: 83 mg/dL (ref 65–99)
Glucose-Capillary: 118 mg/dL — ABNORMAL HIGH (ref 65–99)

## 2016-10-09 LAB — CULTURE, RESPIRATORY W GRAM STAIN

## 2016-10-09 MED ORDER — POTASSIUM CHLORIDE CRYS ER 20 MEQ PO TBCR: 20.0000 meq | EXTENDED_RELEASE_TABLET | Freq: Every day | ORAL | Status: AC

## 2016-10-09 MED ORDER — LEVOFLOXACIN 500 MG PO TABS: 500.0000 mg | ORAL_TABLET | ORAL | 0 refills | Status: AC

## 2016-10-09 MED ORDER — POTASSIUM CHLORIDE CRYS ER 20 MEQ PO TBCR
40.0000 meq | EXTENDED_RELEASE_TABLET | Freq: Every day | ORAL | Status: DC
  Administered 2016-10-09: 40 meq via ORAL
  Filled 2016-10-09 (×2): qty 2

## 2016-10-09 NOTE — Discharge Summary (Signed)
Physician Discharge Summary  [name] [name] [id number] DOB: February 20, 1924 DOA: 10/05/2016  PCP: Arlyss Repress, MD  Admit date: 10/05/2016 Discharge date: 10/09/2016  Admitted From:ALF Disposition: SNF  Recommendations for Outpatient Follow-up:  1. Follow up with PCP in 1-2 weeks 2. Please obtain BMP/CBC in one week  Home Health:SNF Equipment/Devices:none Discharge Condition:stable/comfort care CODE STATUS: DO NOT RESUSCITATE Diet recommendation: Heart healthy diet  Brief/Interim Summary: 83 y.o.malewith medical history significant of DM2, HTN, mild dementia, mild CHF, Afib, arthritis who presented with swelling, weight gain and SOB.  # Chronic diastolic CHF (congestive heart failure)  -Patient initially received IV Lasix and later switched to oral Lasix for lower extremity edema. Serum creatinine level is stable therefore continue on home dose of Lasix.  - Patient's lower extremity edema looks chronic in nature. He has very mild elevation in BNP. He is not hypoxic - echocardiogram with normal systolic function EF 0000000 and normal wall motion.  # CAP (community acquired pneumonia) - CXR with RLL infiltrate/effusion. -Treated with ceftriaxone and azithromycin. Patient is not hypoxic. Cultures negative so far. She is to oral Levaquin on discharge.  # Diabetes mellitus type II, non insulin dependent  - Continue home medication. Encourage oral intake  # Atrial fibrillation, persistent  - Continue beta blocker for rate control - Monitor pulse with vitals  # AKI on CKD stage 3:  - Baseline 1.1 - 1.3. Serum ferritin level is stable at 1.4 today. Recommended outpatient follow-up and repeat lab in a week. Verbalized understanding.  # BPH - Continue Flomax.  # HTN - Monitor blood pressure. Continue metoprolol.  # Goals of care discussion and physical deconditioning: Today, I discussed with the patient's wife and 2 sons at bedside. I discussed about the patient's goals and advance  directives. They confirmed DO NOT RESUSCITATE status and wanted to make patient more comfortable and value on quality of life. Patient was evaluated by physical therapy recommended a skilled level of care. I discussed with the social worker regarding discharge to skilled facility. Patient's family wanted to take him to SNF today. Patient had delirium this morning but still family wants to focus on comfort measures and take him to skilled facility where his wife's and family lives nearby. -They don't want further investigations or escalation of therapy. But want to continue current medical treatment. -Outpatient palliative care referral made. -Patient likely has advanced senile dementia with intermittent delirium.  Discharge Diagnoses:  Active Problems:   Chronic diastolic CHF (congestive heart failure) (HCC)   Diabetes mellitus type II, non insulin dependent (HCC)   Atrial fibrillation (HCC)   CAP (community acquired pneumonia)   Acute kidney injury superimposed on CKD [facility])    Discharge Instructions  Discharge Instructions    Amb Referral to Palliative Care    Complete by:  As directed    Call MD for:  difficulty breathing, headache or visual disturbances    Complete by:  As directed    Call MD for:  persistant dizziness or light-headedness    Complete by:  As directed    Call MD for:  persistant nausea and vomiting    Complete by:  As directed    Call MD for:  severe uncontrolled pain    Complete by:  As directed    Call MD for:  temperature >100.4    Complete by:  As directed    Diet - low sodium heart healthy    Complete by:  As directed    Diet Carb Modified  Complete by:  As directed    Increase activity slowly    Complete by:  As directed      Allergies as of 10/09/2016   No Known Allergies     Medication List    TAKE these medications   acetaminophen 325 MG tablet Commonly known as:  TYLENOL Take 2 tablets (650 mg total) by mouth every 6 (six) hours as needed  for mild pain (or Fever >/= 101).   ferrous sulfate 325 (65 FE) MG tablet Take 1 tablet (325 mg total) by mouth 2 (two) times daily with a meal.   furosemide 20 MG tablet Commonly known as:  LASIX Take 20 mg by mouth See admin instructions. Take 1 tablet (20 mg) by mouth every morning, may take an additional tablet as needed for swelling/ weeping in legs   levofloxacin 500 MG tablet Commonly known as:  LEVAQUIN Take 1 tablet (500 mg total) by mouth every other day.   metFORMIN 500 MG 24 hr tablet Commonly known as:  GLUCOPHAGE-XR Take 500 mg by mouth daily with lunch.   metoprolol succinate 25 MG 24 hr tablet Commonly known as:  TOPROL-XL Take 1 tablet (25 mg total) by mouth daily at 12 noon. Take with or immediately following a meal. What changed:  when to take this  additional instructions   multivitamin with minerals Tabs tablet Take 1 tablet by mouth daily with lunch. Centrum Silver   potassium chloride SA 20 MEQ tablet Commonly known as:  K-DUR,KLOR-CON Take 1 tablet (20 mEq total) by mouth daily.   tamsulosin 0.4 MG Caps capsule Commonly known as:  FLOMAX Take 0.4 mg by mouth daily after supper.   triamcinolone cream 0.1 % Commonly known as:  KENALOG Apply 1 application topically 2 (two) times daily as needed (for itching / irritation).      Follow-up Information    WILLETT,RALPH, MD. Schedule an appointment as soon as possible for a visit in 1 week(s).   Specialty:  Internal Medicine         No Known Allergies  Consultations: None  Procedures/Studies: None  Subjective: Patient was seen and examined at bedside. Patient with intermittent confusion this morning. Review of system is limited. Patient's wife and 2 sons at bedside. I discussed with him in detail. Spent about 25 minutes only in discussion with the patient's family.   Discharge Exam: Vitals:   10/09/16 0555 10/09/16 0944  BP: 113/60 107/71  Pulse: 72 83  Resp: 18 18  Temp: 98.6 F (37  C)    Vitals:   10/08/16 1350 10/08/16 2100 10/09/16 0555 10/09/16 0944  BP:  115/83 113/60 107/71  Pulse: 69 75 72 83  Resp: 18 18 18 18   Temp:  98.2 F (36.8 C) 98.6 F (37 C)   TempSrc:  Oral Oral   SpO2: 96% 96% 94% 97%  Weight:   84.1 kg (185 lb 6.4 oz)   Height:        General: Patient was somnolent, not in distress. Cardiovascular: RRR, S1/S2 +, no rubs, no gallops Respiratory: CTA bilaterally, no wheezing, no rhonchi Abdominal: Soft, NT, ND, bowel sounds + Extremities: Chronic lower extremity venous stasis edema. Unchanged from before    The results of significant diagnostics from this hospitalization (including imaging, microbiology, ancillary and laboratory) are listed below for reference.     Microbiology: Recent Results (from the past 240 hour(s))  MRSA PCR Screening     Status: None   Collection Time: 10/05/16 11:07 PM  Result Value Ref Range Status   MRSA by PCR NEGATIVE NEGATIVE Final    Comment:        The GeneXpert MRSA Assay (FDA approved for NASAL specimens only), is one component of a comprehensive MRSA colonization surveillance program. It is not intended to diagnose MRSA infection nor to guide or monitor treatment for MRSA infections.   Culture, blood (routine x 2) Call MD if unable to obtain prior to antibiotics being given     Status: None (Preliminary result)   Collection Time: 10/05/16 11:57 PM  Result Value Ref Range Status   Specimen Description BLOOD RIGHT ARM  Final   Special Requests AEROBIC BOTTLE ONLY 8ML  Final   Culture NO GROWTH 2 DAYS  Final   Report Status PENDING  Incomplete  Culture, blood (routine x 2) Call MD if unable to obtain prior to antibiotics being given     Status: None (Preliminary result)   Collection Time: 10/06/16 12:04 AM  Result Value Ref Range Status   Specimen Description BLOOD RIGHT FOREARM  Final   Special Requests BOTTLES DRAWN AEROBIC AND ANAEROBIC 10ML  Final   Culture NO GROWTH 2 DAYS  Final    Report Status PENDING  Incomplete  Culture, sputum-assessment     Status: None   Collection Time: 10/06/16  5:48 PM  Result Value Ref Range Status   Specimen Description EXPECTORATED SPUTUM  Final   Special Requests NONE  Final   Sputum evaluation THIS SPECIMEN IS ACCEPTABLE FOR SPUTUM CULTURE  Final   Report Status 10/07/2016 FINAL  Final  Culture, respiratory (NON-Expectorated)     Status: None   Collection Time: 10/06/16  5:48 PM  Result Value Ref Range Status   Specimen Description EXPECTORATED SPUTUM  Final   Special Requests NONE Reflexed from [id number]  Final   Gram Stain   Final    ABUNDANT WBC PRESENT, PREDOMINANTLY PMN MODERATE GRAM POSITIVE COCCI IN PAIRS FEW GRAM NEGATIVE RODS FEW GRAM NEGATIVE COCCOBACILLI    Culture Consistent with normal respiratory flora.  Final   Report Status 10/09/2016 FINAL  Final     Labs: BNP (last 3 results)  Recent Labs  10/05/16 1620  BNP [id number]*   Basic Metabolic Panel:  Recent Labs Lab 10/05/16 1620 10/06/16 0000 10/07/16 0419 10/08/16 0656 10/09/16 0801  NA 137  --  138 138 137  K 3.7  --  3.5 3.4* 3.2*  CL 96*  --  98* 95* 93*  CO2 30  --  30 30 29   GLUCOSE 95  --  86 106* 86  BUN 24*  --  22* 26* 30*  CREATININE 1.18 1.29* 1.39* 1.51* 1.45*  CALCIUM 9.0  --  8.8* 9.0 8.9   Liver Function Tests:  Recent Labs Lab 10/05/16 1620  AST 55*  ALT 33  ALKPHOS 98  BILITOT 2.0*  PROT 6.1*  ALBUMIN 3.5   No results for input(s): LIPASE, AMYLASE in the last 168 hours. No results for input(s): AMMONIA in the last 168 hours. CBC:  Recent Labs Lab 10/05/16 1620 10/06/16 0000  WBC 6.4 8.2  NEUTROABS 5.2  --   HGB 11.1* 10.9*  HCT 34.2* 33.8*  MCV 100.3* 99.4  PLT 91* 100*   Cardiac Enzymes:  Recent Labs Lab 10/05/16 1620  TROPONINI <0.03   BNP: Invalid input(s): POCBNP CBG:  Recent Labs Lab 10/08/16 1202 10/08/16 1754 10/08/16 2204 10/09/16 0739 10/09/16 0942  GLUCAP 118* 120* 127* 83 118*    D-Dimer No  results for input(s): DDIMER in the last 72 hours. Hgb A1c No results for input(s): HGBA1C in the last 72 hours. Lipid Profile No results for input(s): CHOL, HDL, LDLCALC, TRIG, CHOLHDL, LDLDIRECT in the last 72 hours. Thyroid function studies No results for input(s): TSH, T4TOTAL, T3FREE, THYROIDAB in the last 72 hours.  Invalid input(s): FREET3 Anemia work up No results for input(s): VITAMINB12, FOLATE, FERRITIN, TIBC, IRON, RETICCTPCT in the last 72 hours. Urinalysis    Component Value Date/Time   COLORURINE AMBER (A) 10/08/2016 1205   APPEARANCEUR CLEAR 10/08/2016 1205   LABSPEC 1.015 10/08/2016 1205   PHURINE 5.0 10/08/2016 1205   GLUCOSEU NEGATIVE 10/08/2016 1205   HGBUR NEGATIVE 10/08/2016 1205   [place] 10/08/2016 [place] 10/08/2016 1205   PROTEINUR 30 (A) 10/08/2016 1205   NITRITE NEGATIVE 10/08/2016 1205   LEUKOCYTESUR NEGATIVE 10/08/2016 1205   Sepsis Labs Invalid input(s): PROCALCITONIN,  WBC,  LACTICIDVEN Microbiology Recent Results (from the past 240 hour(s))  MRSA PCR Screening     Status: None   Collection Time: 10/05/16 11:07 PM  Result Value Ref Range Status   MRSA by PCR NEGATIVE NEGATIVE Final    Comment:        The GeneXpert MRSA Assay (FDA approved for NASAL specimens only), is one component of a comprehensive MRSA colonization surveillance program. It is not intended to diagnose MRSA infection nor to guide or monitor treatment for MRSA infections.   Culture, blood (routine x 2) Call MD if unable to obtain prior to antibiotics being given     Status: None (Preliminary result)   Collection Time: 10/05/16 11:57 PM  Result Value Ref Range Status   Specimen Description BLOOD RIGHT ARM  Final   Special Requests AEROBIC BOTTLE ONLY 8ML  Final   Culture NO GROWTH 2 DAYS  Final   Report Status PENDING  Incomplete  Culture, blood (routine x 2) Call MD if unable to obtain prior to antibiotics being given      Status: None (Preliminary result)   Collection Time: 10/06/16 12:04 AM  Result Value Ref Range Status   Specimen Description BLOOD RIGHT FOREARM  Final   Special Requests BOTTLES DRAWN AEROBIC AND ANAEROBIC 10ML  Final   Culture NO GROWTH 2 DAYS  Final   Report Status PENDING  Incomplete  Culture, sputum-assessment     Status: None   Collection Time: 10/06/16  5:48 PM  Result Value Ref Range Status   Specimen Description EXPECTORATED SPUTUM  Final   Special Requests NONE  Final   Sputum evaluation THIS SPECIMEN IS ACCEPTABLE FOR SPUTUM CULTURE  Final   Report Status 10/07/2016 FINAL  Final  Culture, respiratory (NON-Expectorated)     Status: None   Collection Time: 10/06/16  5:48 PM  Result Value Ref Range Status   Specimen Description EXPECTORATED SPUTUM  Final   Special Requests NONE Reflexed from [id number]  Final   Gram Stain   Final    ABUNDANT WBC PRESENT, PREDOMINANTLY PMN MODERATE GRAM POSITIVE COCCI IN PAIRS FEW GRAM NEGATIVE RODS FEW GRAM NEGATIVE COCCOBACILLI    Culture Consistent with normal respiratory flora.  Final   Report Status 10/09/2016 FINAL  Final     Time coordinating discharge: 35 minutes  SIGNED:   Rosita Fire, MD  [facility] 10/09/2016, 11:31 AM  If 7PM-7AM, please contact night-coverage [email or web address] Password [email or web address]

## 2016-10-09 NOTE — Progress Notes (Signed)
Per Md patient seem lethargic vital signs taken and recorded, stable. cbg 118 .will continue to monitor.

## 2016-10-09 NOTE — Progress Notes (Signed)
Patient discharged to [facility]. Report was given to receiving nurse. Patient was transpoted by PTAR. Patient was alert , VSS on discharge.

## 2016-10-09 NOTE — Care Management Important Message (Signed)
Important Message  Patient Details  Name: [name] [name] MRN: [id number] Date of Birth: [birth year]   Medicare Important Message Given:  Yes    Aviyanna Colbaugh 10/09/2016, 1:16 PM

## 2016-10-09 NOTE — Progress Notes (Signed)
Report given to Nira Conn [place],RN at [place] and rehab.

## 2016-10-09 NOTE — Clinical Social Work Note (Signed)
Clinical Social Work Assessment  Patient Details  Name: [name] [name] MRN: [id number] Date of Birth: May 12, 1924  Date of referral:  10/09/16               Reason for consult:  Discharge Planning                Permission sought to share information with:  Family Supports, Customer service manager Permission granted to share information::  Yes, Verbal Permission Granted  Name::     Michele Risner  Agency::     Relationship::  Spouse  Contact Information:  [phone]  Housing/Transportation Living arrangements for the past 2 months:  [facility]) Source of Information:  Patient, Spouse Patient Interpreter Needed:  None Criminal Activity/Legal Involvement Pertinent to Current Situation/Hospitalization:  No - Comment as needed Significant Relationships:  Adult Children, Spouse Lives with:  Spouse Do you feel safe going back to the place where you live?  Yes Need for family participation in patient care:  Yes (Comment)  Care giving concerns:  None   Social Worker assessment / plan:  CSW received consult for SNF placement in STR. Pt is from [facility] living. CSW introduced self and provided explanation of CSW responsibilities. Spouse-Helen and son-David were at bedside. Pt is ready for discharge today. Pt and family are agreeable to discharging to [facility] [place] for short term rehab. CSW confirmed availability with Candace at [facility]. CSW discussed transportation via [place]. Family agreed.   Employment status:  Retired Forensic scientist:  Medicare PT Recommendations:  [place] / Referral to community resources:  Acute Rehab  Patient/Family's Response to care:  Pt and family was appreciative of CSW support.   Patient/Family's Understanding of and Emotional Response to Diagnosis, Current Treatment, and Prognosis:  Pt in agreement with discharge plan.  Emotional Assessment Appearance:   Appears stated age Attitude/Demeanor/Rapport:   (Appropriate) Affect (typically observed):  Accepting, Pleasant, Adaptable Orientation:  Oriented to Self, Oriented to Place, Oriented to  Time, Oriented to Situation Alcohol / Substance use:  Never Used Psych involvement (Current and /or in the community):  No (Comment)  Discharge Needs  Concerns to be addressed:  Adjustment to Illness Readmission within the last 30 days:  No Current discharge risk:  Chronically ill Barriers to Discharge:  Continued Medical Work up   [employer], LCSW 10/09/2016, 11:43 AM

## 2016-10-09 NOTE — NC FL2 (Signed)
[place] MEDICAID FL2 LEVEL OF CARE SCREENING TOOL     IDENTIFICATION  Patient Name: [name] [name] Birthdate: August 18, 1924 Sex: male Admission Date (Current Location): 10/05/2016  [facility] and [place] Number:  Herbalist and Address:  [facility] [place] [address] [place], [place] [address]      Provider Number: [id number]  Attending Physician Name and Address:  Rosita Fire, MD  Relative Name and Phone Number:       Current Level of Care: Hospital Recommended Level of Care: [place] Prior Approval Number:    Date Approved/Denied:   PASRR Number: [id number]  Discharge Plan: SNF    Current Diagnoses: Patient Active Problem List   Diagnosis Date Noted  . Lewy body dementia without behavioral disturbance   . Acute kidney injury superimposed on CKD ([place])   . Community acquired pneumonia 10/05/2016  . Hyponatremia 08/04/2016  . Dehydration 08/04/2016  . Diarrhea 08/04/2016  . Diabetes mellitus type II, non insulin dependent ([place]) 08/04/2016  . Hypertension 08/04/2016  . UTI (urinary tract infection) 08/04/2016  . Atrial fibrillation ([place]) 08/04/2016  . Normocytic anemia 08/04/2016  . Thrombocytopenia ([place]) 08/04/2016  . Tachy-brady syndrome ([place]) 07/14/2015  . Pacemaker 07/14/2015  . Chronic diastolic CHF (congestive heart failure) ([place])   . Dyspnea 03/31/2014  . Symptomatic anemia 03/31/2014    Orientation RESPIRATION BLADDER Height & Weight      (disoriented x4)  Normal Incontinent Weight: 185 lb 6.4 oz (84.1 kg) Height:  5\' 7"  (170.2 cm)  BEHAVIORAL SYMPTOMS/MOOD NEUROLOGICAL BOWEL NUTRITION STATUS      Continent Diet (Low sodium, heart healthy, carb modified; thin fluids)  AMBULATORY STATUS COMMUNICATION OF NEEDS Skin   Extensive Assist Verbally Normal                       Personal Care Assistance Level of Assistance  Bathing, Feeding, Dressing Bathing Assistance: Maximum assistance Feeding  assistance: Limited assistance Dressing Assistance: Maximum assistance     Functional Limitations Info  Sight, Hearing, Speech Sight Info: Adequate Hearing Info: Adequate Speech Info: Adequate    SPECIAL CARE FACTORS FREQUENCY  PT (By licensed PT), OT (By licensed OT)     PT Frequency: 5 OT Frequency: 5            Contractures Contractures Info: Not present    Additional Factors Info  Code Status, Allergies Code Status Info: DNR Allergies Info: NKA           Current Medications (10/09/2016):  This is the current hospital active medication list Current Facility-Administered Medications  Medication Dose Route Frequency Provider Last Rate Last Dose  . acetaminophen (TYLENOL) tablet 650 mg  650 mg Oral Q6H PRN Sid Falcon, MD      . azithromycin [facility]) tablet 500 mg  500 mg Oral Q24H Sid Falcon, MD   500 mg at 10/08/16 1832  . cefTRIAXone (ROCEPHIN) 1 g in dextrose 5 % 50 mL IVPB  1 g Intravenous Q24H Sid Falcon, MD   1 g at 10/08/16 1833  . enoxaparin (LOVENOX) injection 40 mg  40 mg Subcutaneous Q24H Sid Falcon, MD   40 mg at 10/09/16 1001  . ferrous sulfate tablet 325 mg  325 mg Oral BID WC Sid Falcon, MD   325 mg at 10/09/16 0843  . insulin aspart (novoLOG) injection 0-9 Units  0-9 Units Subcutaneous TID WC Dron Tanna Furry, MD   1 Units  at 10/06/16 1703  . ipratropium-albuterol (DUONEB) 0.5-2.5 (3) MG/3ML nebulizer solution 3 mL  3 mL Nebulization Q4H PRN Dron Tanna Furry, MD      . metoprolol succinate (TOPROL-XL) 24 hr tablet 25 mg  25 mg Oral Q1200 Dron Tanna Furry, MD   25 mg at 10/09/16 1152  . multivitamin liquid 15 mL  15 mL Oral Q1200 Sid Falcon, MD   15 mL at 10/09/16 1154  . potassium chloride SA (K-DUR,KLOR-CON) CR tablet 40 mEq  40 mEq Oral Daily Dron Tanna Furry, MD   40 mEq at 10/09/16 1152  . tamsulosin (FLOMAX) capsule 0.4 mg  0.4 mg Oral QHS Sid Falcon, MD   0.4 mg at 10/08/16 2102     Discharge  Medications: Please see discharge summary for a list of discharge medications.  Relevant Imaging Results:  Relevant Lab Results:   Additional Information SSN: [id number]  Truitt Merle, LCSW

## 2016-10-09 NOTE — Clinical Social Work Placement (Signed)
   CLINICAL SOCIAL WORK PLACEMENT  NOTE  Date:  10/09/2016  Patient Details  Name: Tayon Nibert MRN: [id number] Date of Birth: Apr 27, 1924  Clinical Social Work is seeking post-discharge placement for this patient at the [place] level of care (*CSW will initial, date and re-position this form in  chart as items are completed):  Yes   Patient/family provided with [facility] Work Department's list of facilities offering this level of care within the geographic area requested by the patient (or if unable, by the patient's family).  Yes   Patient/family informed of their freedom to choose among providers that offer the needed level of care, that participate in Medicare, Medicaid or managed care program needed by the patient, have an available bed and are willing to accept the patient.  Yes   Patient/family informed of [facility]'s ownership interest in [facility] and [facility], as well as of the fact that they are under no obligation to receive care at these facilities.  PASRR submitted to EDS on       PASRR number received on       Existing PASRR number confirmed on 10/09/16     FL2 transmitted to all facilities in geographic area requested by pt/family on 10/09/16     FL2 transmitted to all facilities within larger geographic area on       Patient informed that his/her managed care company has contracts with or will negotiate with certain facilities, including the following:        Yes   Patient/family informed of bed offers received.  Patient chooses bed at [facility]     Physician recommends and patient chooses bed at      Patient to be transferred to [facility] [place] on 10/09/16.  Patient to be transferred to facility by [facility]     Patient family notified on 10/09/16 of transfer.  Name of family member notified:  Pt's wife, Mrs. Golaszewski     PHYSICIAN       Additional Comment:     _______________________________________________ Truitt Merle, LCSW 10/09/2016, 1:46 PM

## 2016-10-09 NOTE — Clinical Social Work Note (Signed)
Pt is ready for discharge today and will go to [facility] [place]. Pt and wife are aware and agreeable to discharge plan. CSW sent clinicals to [facility] and communicated with admissions for room and report. RN has called report. [facility] called for transportation. CSW is signing off as no further needs identified.   Oretha Ellis, MSW, [place] Social Worker  [phone]

## 2016-10-11 LAB — CULTURE, BLOOD (ROUTINE X 2)
CULTURE: NO GROWTH
CULTURE: NO GROWTH

## 2016-11-01 DEATH — deceased

## 2018-01-11 IMAGING — DX DG CHEST 2V
2 series · 2 of 2 positions shown · non-contrast
Comparison: Radiographs March 31, 2014.

CLINICAL DATA: Cough, shortness of breath.

EXAM:
CHEST  2 VIEW

[chest lat]
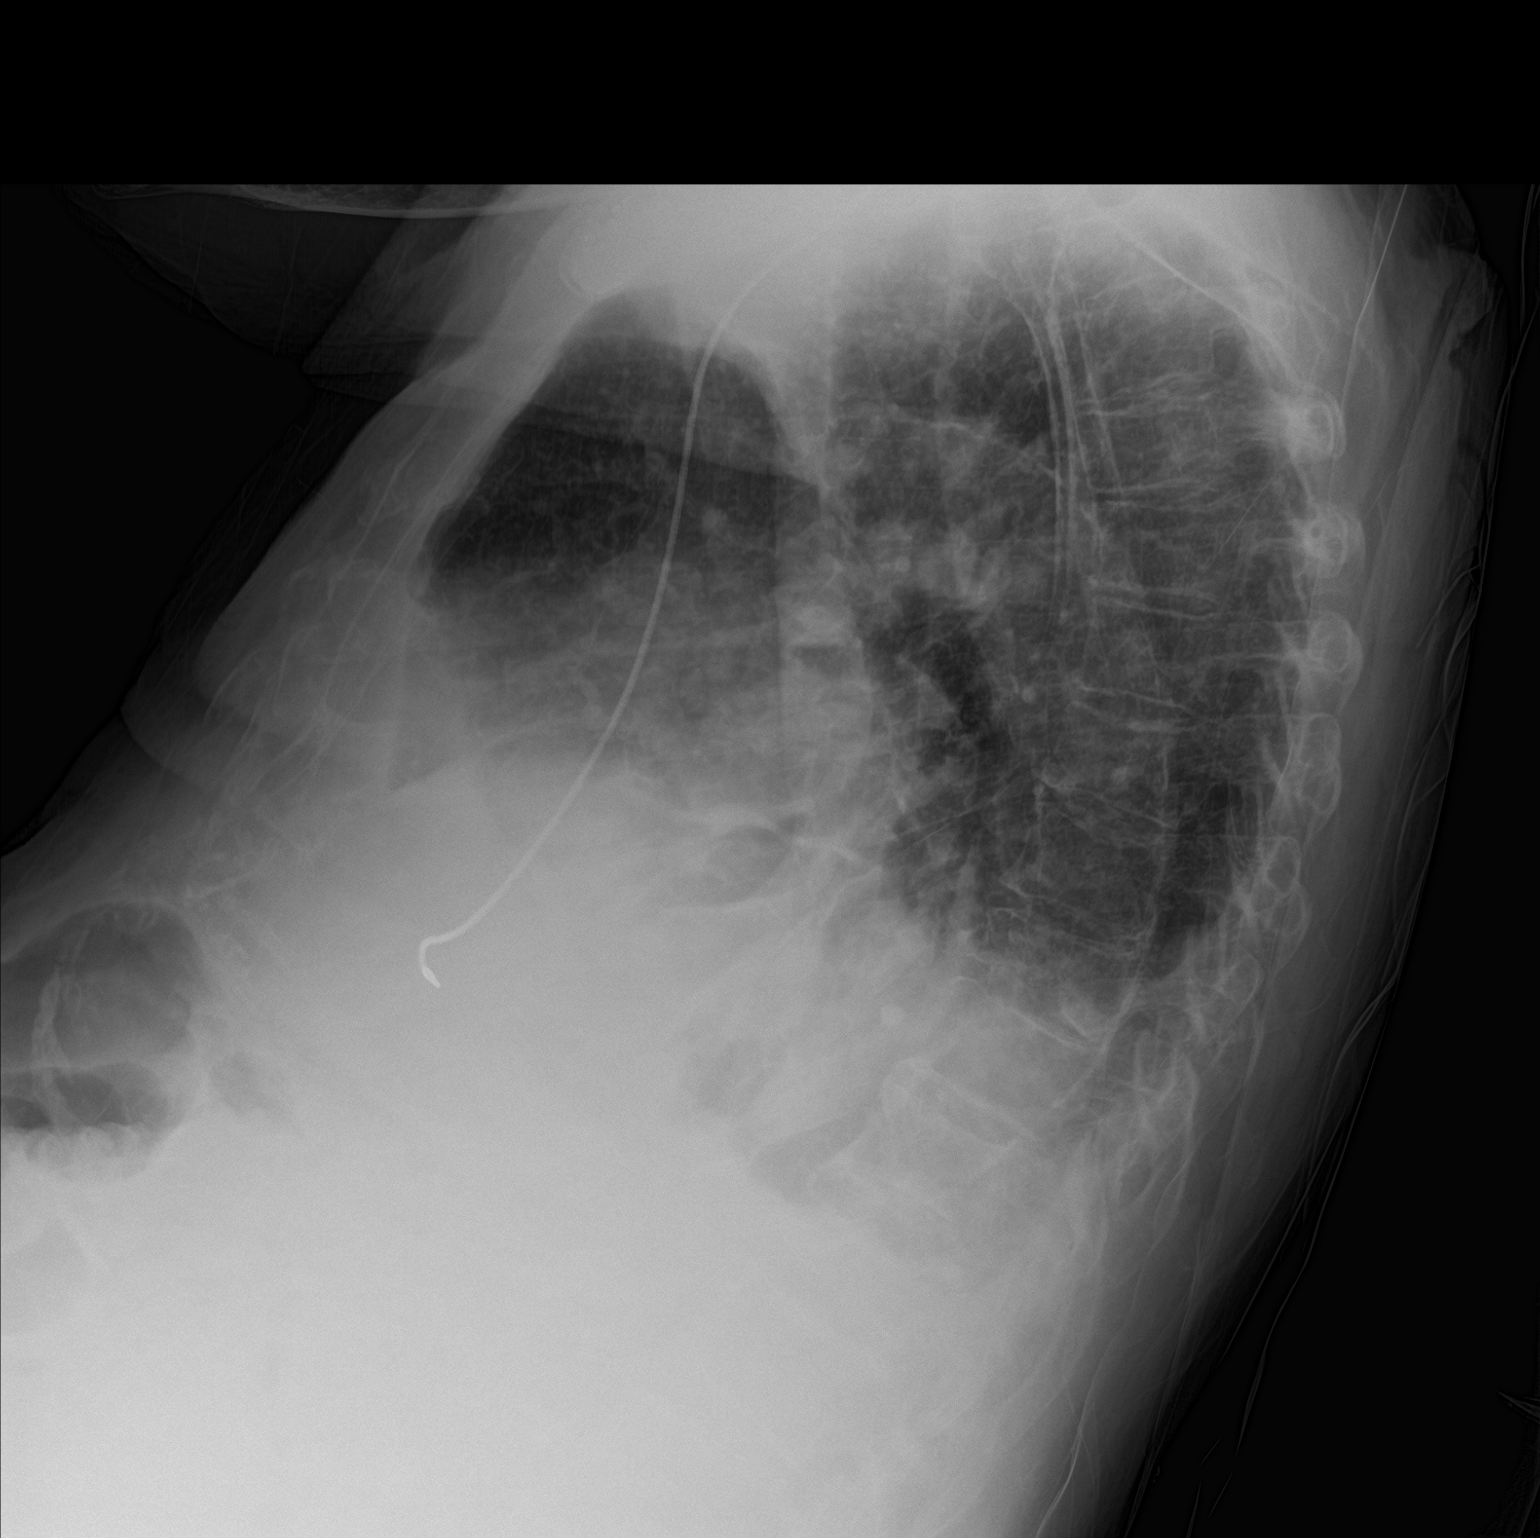

[chest ap strecther]
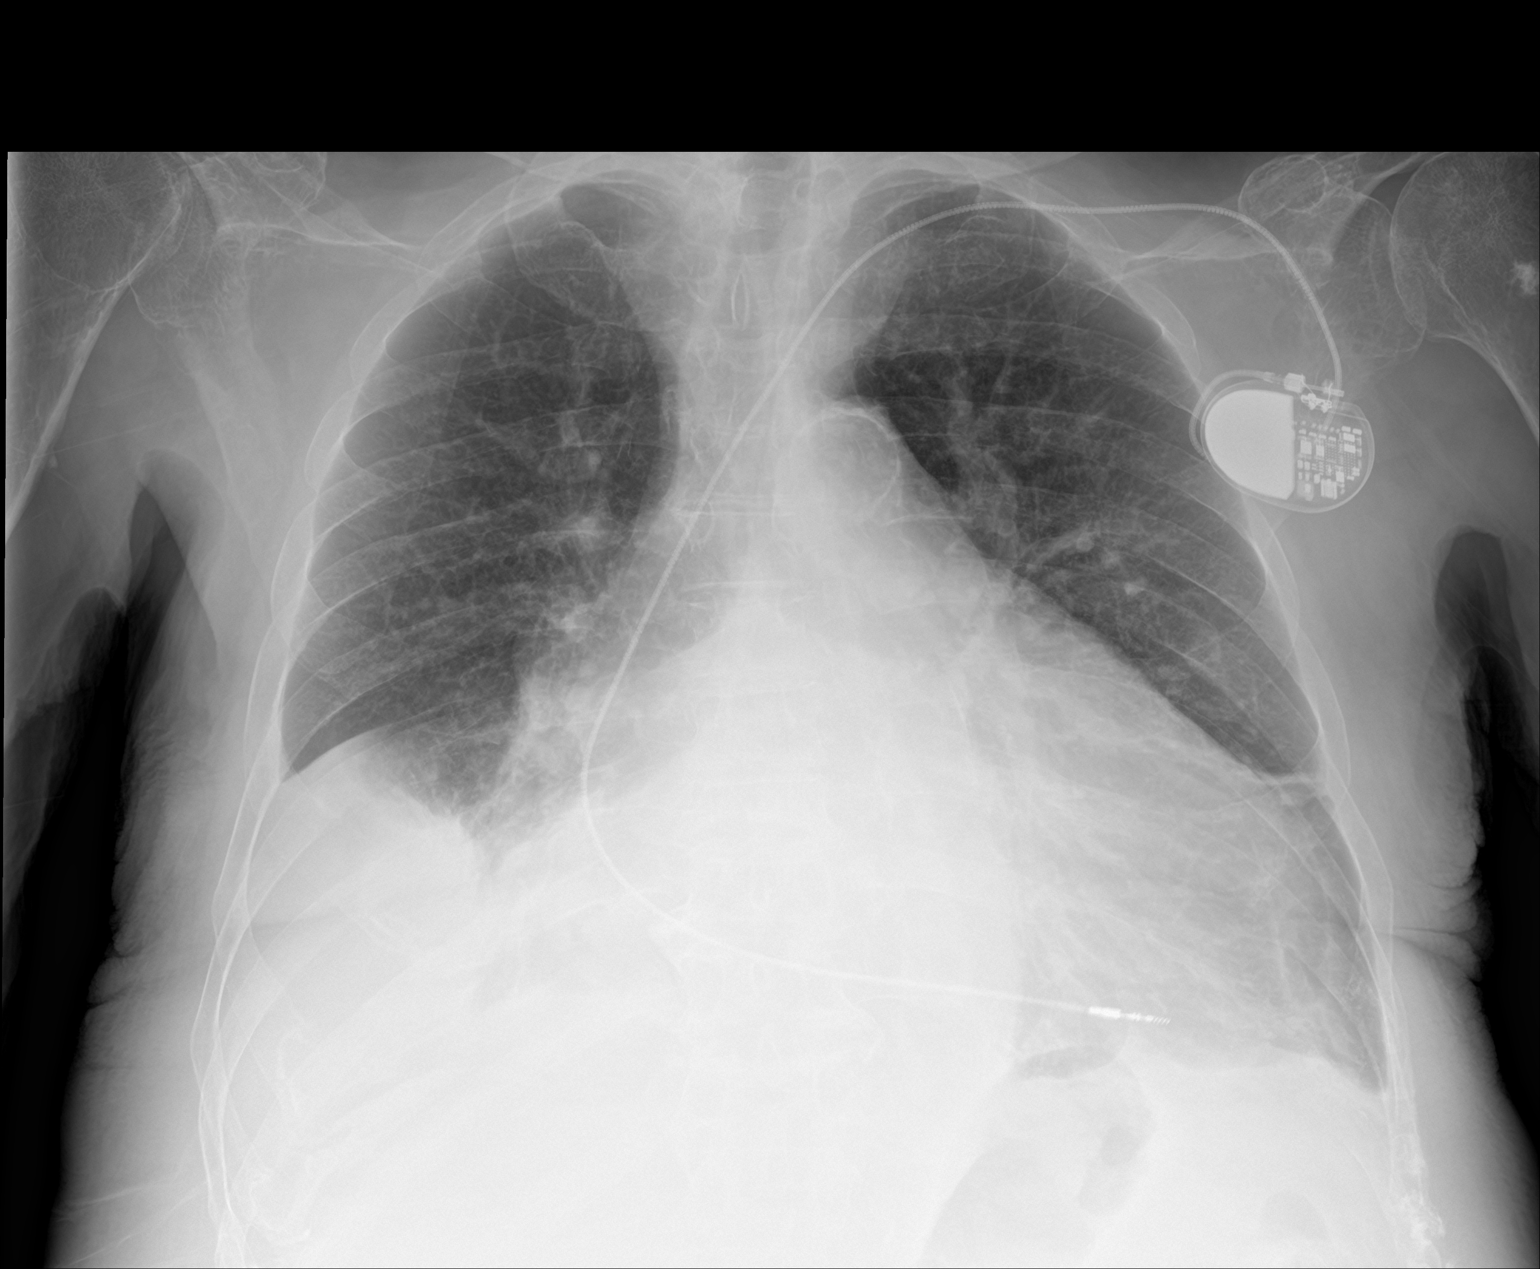

[2 of 2 positions shown; findings below may reference images not displayed]

FINDINGS: Stable cardiomegaly. Atherosclerosis of thoracic aorta is noted.
Single lead left-sided pacemaker is unchanged in position. No
pneumothorax is noted. Stable left basilar scarring is noted.
Increased right basilar opacity is noted concerning for worsening
atelectasis or infiltrate with associated pleural effusion. Bony
thorax is unremarkable.
IMPRESSION: Aortic atherosclerosis. Increased right basilar opacity is noted
concerning for worsening pneumonia or atelectasis with associated
pleural effusion.

## 2018-11-25 IMAGING — US US EXTREM LOW VENOUS*L*
1 series · 13 of 24 positions shown · non-contrast
Comparison: None.

CLINICAL DATA: Left lower extremity edema.



[Series 1: us extrem low venous*left* · 0.08mm/px · 13 of 41 slices shown]
[im 1/41]
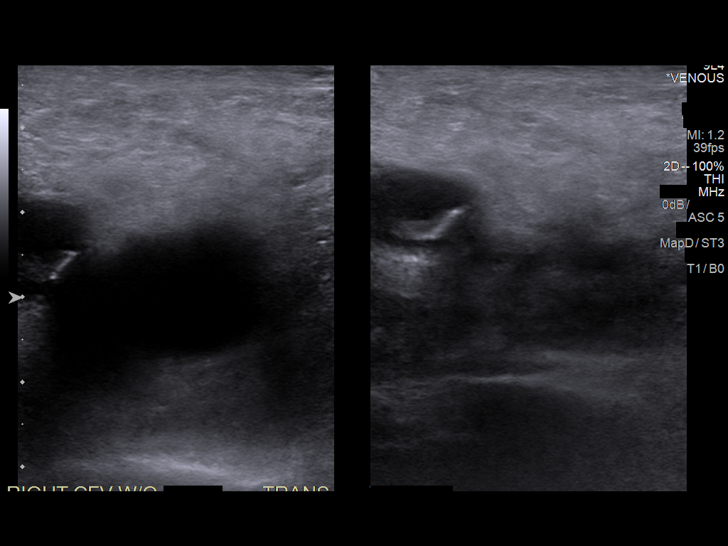
[im 4/41]
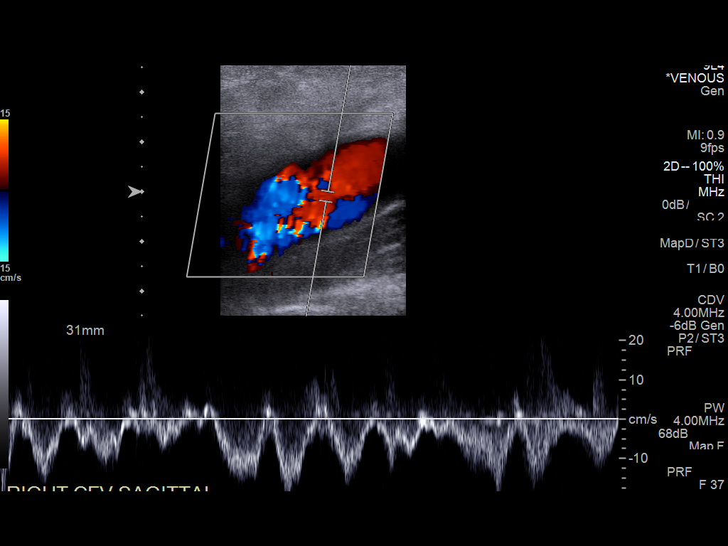
[im 7/41]
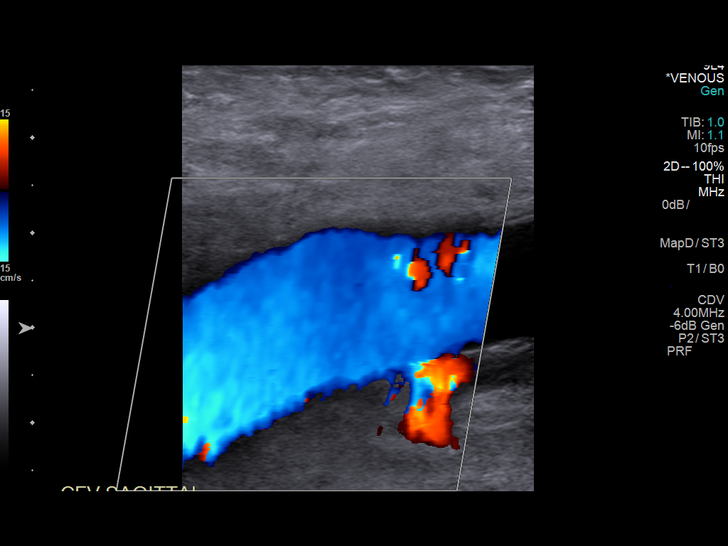
[im 11/41]
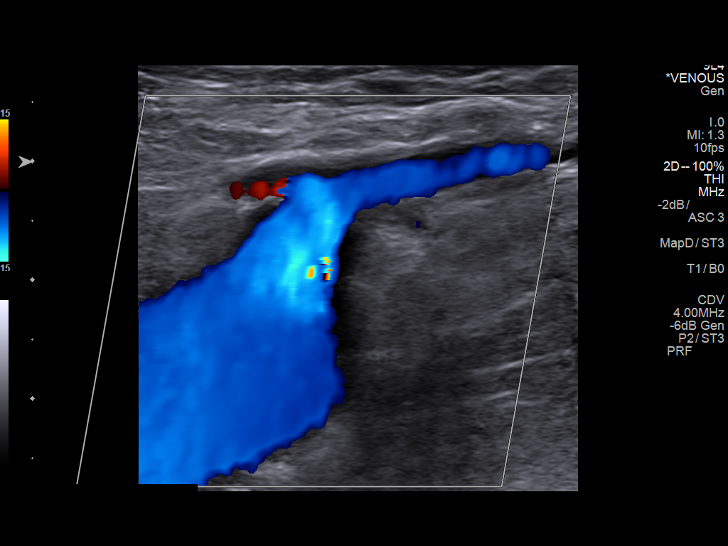
[im 14/41]
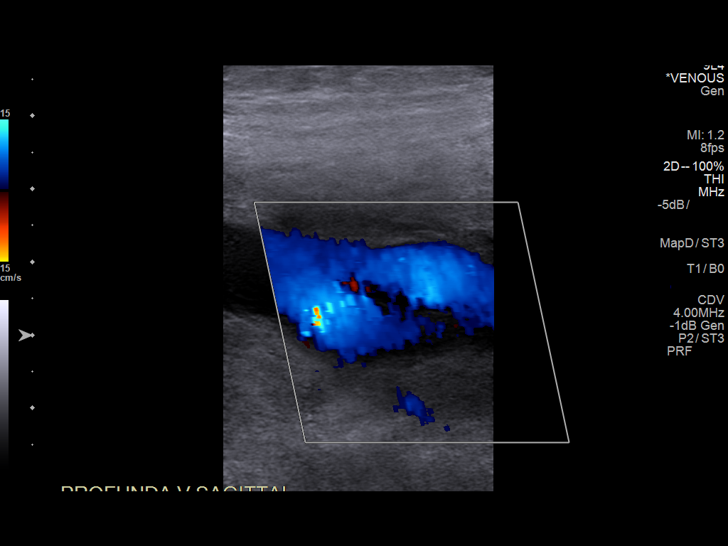
[im 18/41]
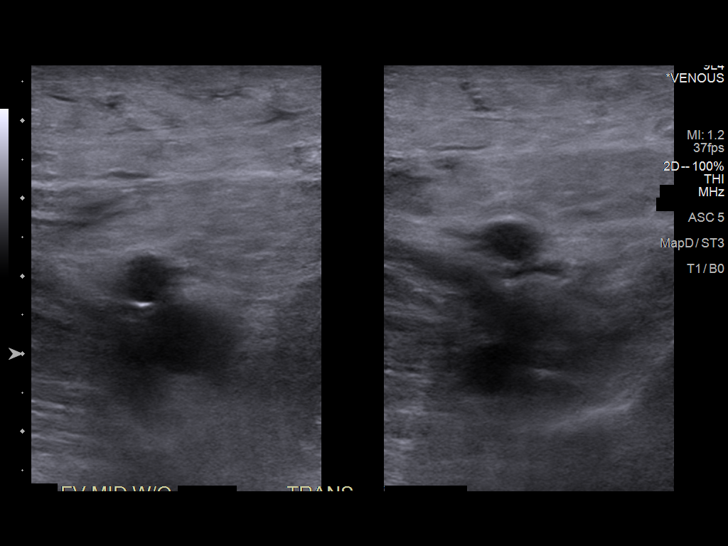
[im 21/41]
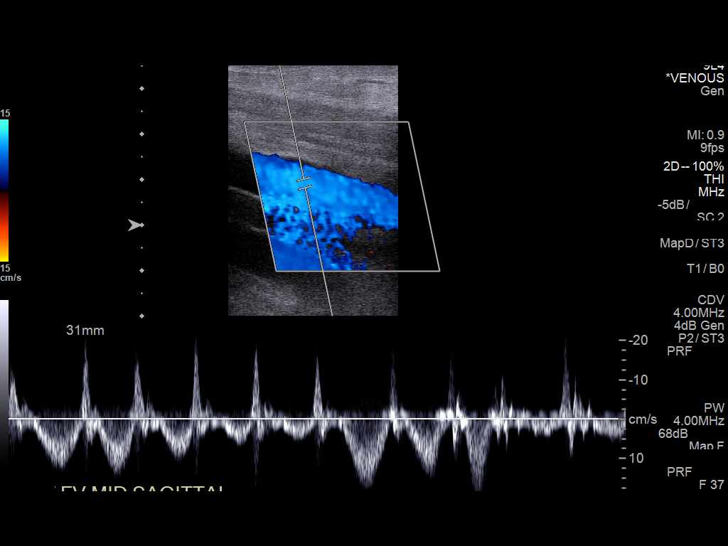
[im 23/41]
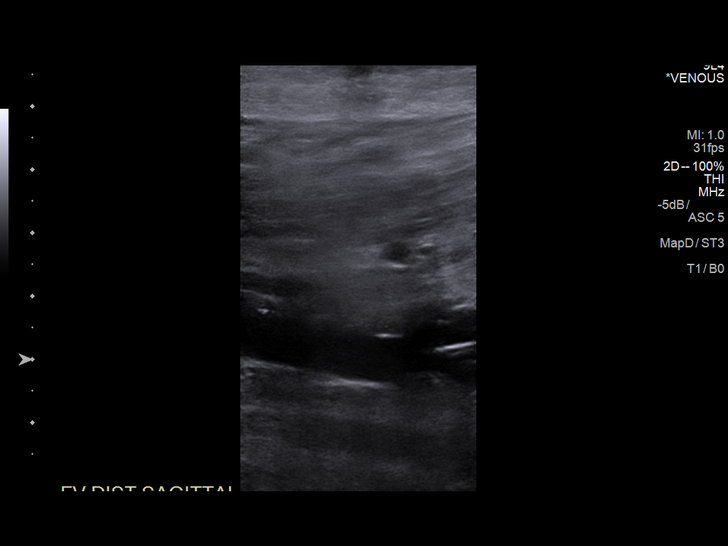
[im 27/41]
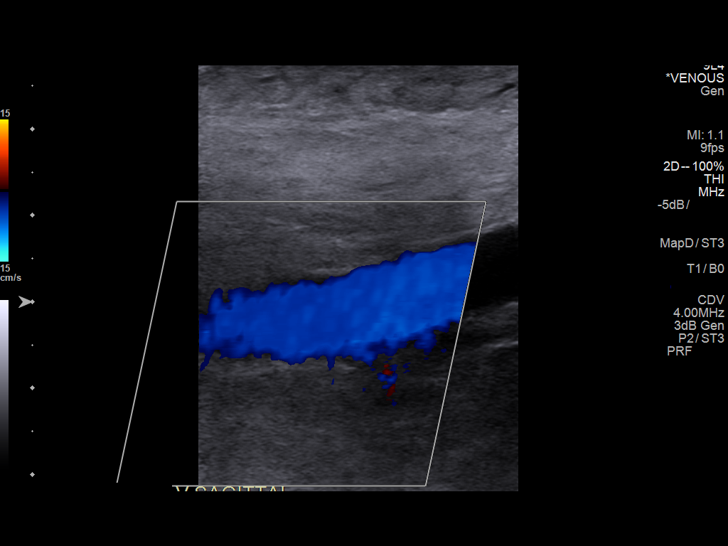
[im 30/41]
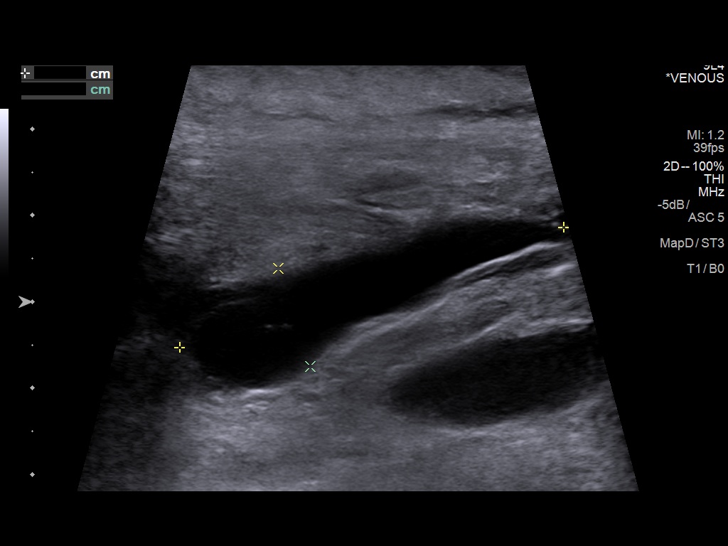
[im 34/41]
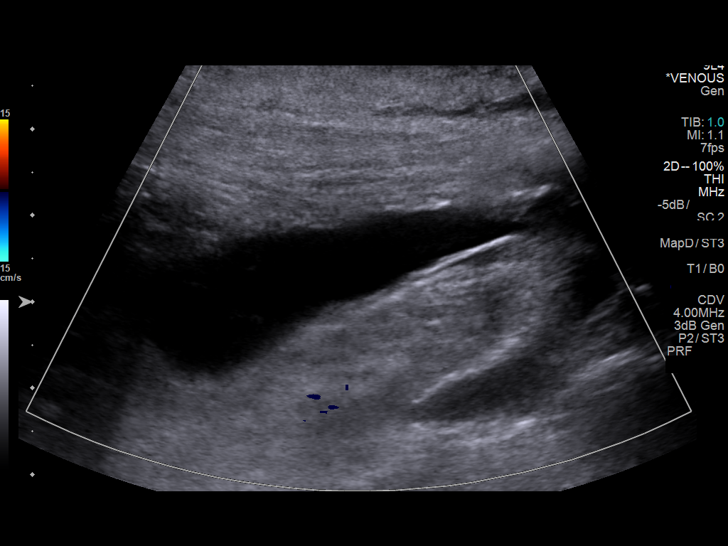
[im 37/41]
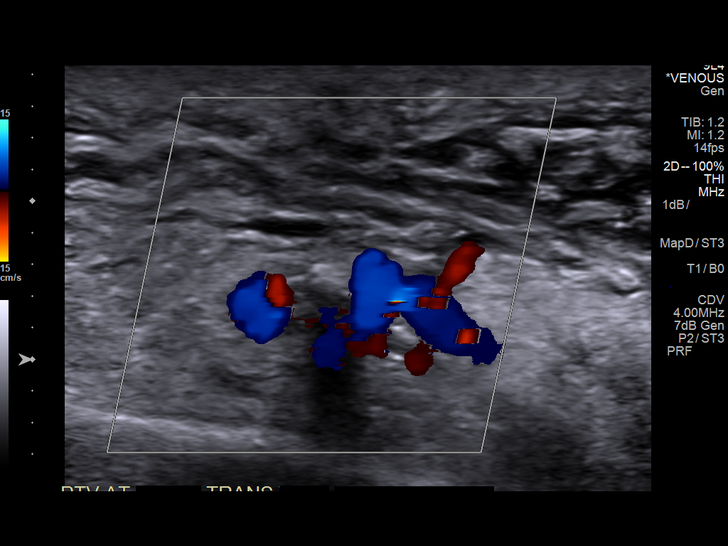
[im 41/41]
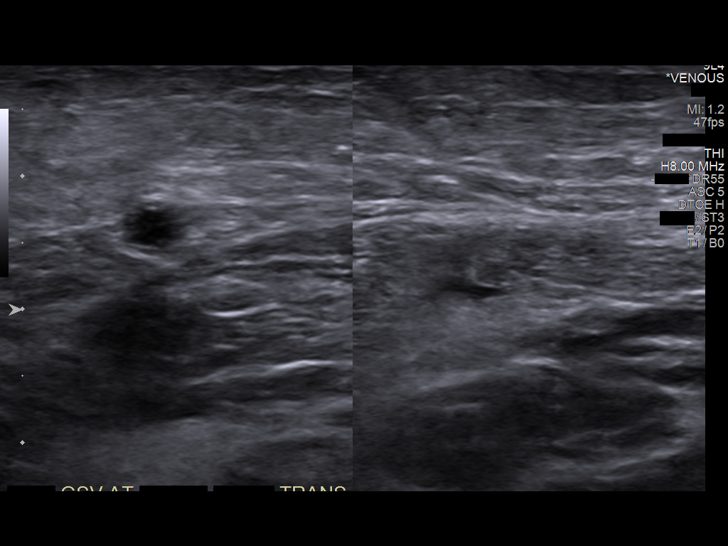

[13 of 24 positions shown; findings below may reference images not displayed]

FINDINGS: Contralateral Common Femoral Vein: Respiratory phasicity is normal
and symmetric with the symptomatic side. No evidence of thrombus.
Normal compressibility.

Common Femoral Vein: No evidence of thrombus. Normal
compressibility, respiratory phasicity and response to augmentation.

Saphenofemoral Junction: No evidence of thrombus. Normal
compressibility and flow on color Doppler imaging.

Profunda Femoral Vein: No evidence of thrombus. Normal
compressibility and flow on color Doppler imaging.

Femoral Vein: No evidence of thrombus. Normal compressibility,
respiratory phasicity and response to augmentation.

Popliteal Vein: No evidence of thrombus. Normal compressibility,
respiratory phasicity and response to augmentation.

Calf Veins: No evidence of thrombus. Normal compressibility and flow
on color Doppler imaging.

Superficial Great Saphenous Vein: No evidence of thrombus. Normal
compressibility and flow on color Doppler imaging.

Venous Reflux:  None.

Other Findings: Well-circumscribed anechoic fluid collection in the
popliteal fossa measures approximately 4.7 x 1.2 x 2.2 cm and is
consistent with a Baker's cyst.
IMPRESSION: No evidence of left lower extremity deep venous thrombosis.
Popliteal fossa Baker's cyst identified.
